# Patient Record
Sex: Female | Born: 1975 | Race: White | Hispanic: No | Marital: Married | Smoking: Never smoker
Health system: Southern US, Community
[De-identification: ages and names within clinical notes are randomized; demographics above are authoritative.]

## PROBLEM LIST (undated history)

## (undated) ENCOUNTER — Inpatient Hospital Stay (HOSPITAL_COMMUNITY): Payer: Self-pay

## (undated) ENCOUNTER — Ambulatory Visit: Payer: Commercial Managed Care - PPO

## (undated) DIAGNOSIS — K219 Gastro-esophageal reflux disease without esophagitis: Secondary | ICD-10-CM

## (undated) DIAGNOSIS — F329 Major depressive disorder, single episode, unspecified: Secondary | ICD-10-CM

## (undated) DIAGNOSIS — E7800 Pure hypercholesterolemia, unspecified: Secondary | ICD-10-CM

## (undated) DIAGNOSIS — F32A Depression, unspecified: Secondary | ICD-10-CM

## (undated) DIAGNOSIS — Z8701 Personal history of pneumonia (recurrent): Secondary | ICD-10-CM

## (undated) DIAGNOSIS — Z8659 Personal history of other mental and behavioral disorders: Secondary | ICD-10-CM

## (undated) DIAGNOSIS — M545 Low back pain, unspecified: Secondary | ICD-10-CM

## (undated) DIAGNOSIS — F419 Anxiety disorder, unspecified: Secondary | ICD-10-CM

## (undated) DIAGNOSIS — R7303 Prediabetes: Secondary | ICD-10-CM

## (undated) HISTORY — DX: Major depressive disorder, single episode, unspecified: F32.9

## (undated) HISTORY — DX: Depression, unspecified: F32.A

## (undated) HISTORY — DX: Anxiety disorder, unspecified: F41.9

## (undated) HISTORY — DX: Personal history of pneumonia (recurrent): Z87.01

## (undated) HISTORY — DX: Prediabetes: R73.03

## (undated) HISTORY — DX: Pure hypercholesterolemia, unspecified: E78.00

## (undated) HISTORY — DX: Personal history of other mental and behavioral disorders: Z86.59

## (undated) HISTORY — DX: Low back pain: M54.5

## (undated) HISTORY — PX: TONSILLECTOMY: SHX5217

## (undated) HISTORY — DX: Gastro-esophageal reflux disease without esophagitis: K21.9

## (undated) HISTORY — PX: APPENDECTOMY: SHX54

## (undated) HISTORY — DX: Low back pain, unspecified: M54.50

---

## 1993-11-06 HISTORY — PX: BREAST REDUCTION SURGERY: SHX8

## 2009-03-25 ENCOUNTER — Emergency Department (HOSPITAL_COMMUNITY): Admission: EM | Admit: 2009-03-25 | Discharge: 2009-03-25 | Payer: Self-pay | Admitting: Emergency Medicine

## 2009-05-26 ENCOUNTER — Ambulatory Visit: Payer: Self-pay | Admitting: Internal Medicine

## 2009-06-10 ENCOUNTER — Ambulatory Visit: Payer: Self-pay | Admitting: Internal Medicine

## 2009-07-08 ENCOUNTER — Ambulatory Visit: Payer: Self-pay | Admitting: Internal Medicine

## 2009-09-16 ENCOUNTER — Encounter: Payer: Self-pay | Admitting: Internal Medicine

## 2010-05-12 ENCOUNTER — Ambulatory Visit: Payer: Self-pay | Admitting: Pulmonary Disease

## 2010-05-12 DIAGNOSIS — R7309 Other abnormal glucose: Secondary | ICD-10-CM | POA: Insufficient documentation

## 2010-05-12 DIAGNOSIS — K219 Gastro-esophageal reflux disease without esophagitis: Secondary | ICD-10-CM

## 2010-05-12 DIAGNOSIS — E7800 Pure hypercholesterolemia, unspecified: Secondary | ICD-10-CM

## 2010-06-01 ENCOUNTER — Telehealth: Payer: Self-pay | Admitting: Internal Medicine

## 2010-06-21 ENCOUNTER — Ambulatory Visit: Payer: Self-pay | Admitting: Internal Medicine

## 2010-06-23 ENCOUNTER — Ambulatory Visit: Payer: Self-pay | Admitting: Pulmonary Disease

## 2010-06-27 ENCOUNTER — Ambulatory Visit: Payer: Self-pay | Admitting: Pulmonary Disease

## 2010-06-28 DIAGNOSIS — F329 Major depressive disorder, single episode, unspecified: Secondary | ICD-10-CM

## 2010-06-28 DIAGNOSIS — M545 Low back pain: Secondary | ICD-10-CM

## 2010-06-28 DIAGNOSIS — J189 Pneumonia, unspecified organism: Secondary | ICD-10-CM | POA: Insufficient documentation

## 2010-06-28 DIAGNOSIS — K5900 Constipation, unspecified: Secondary | ICD-10-CM | POA: Insufficient documentation

## 2010-07-03 LAB — CONVERTED CEMR LAB
BUN: 11 mg/dL (ref 6–23)
Basophils Absolute: 0 10*3/uL (ref 0.0–0.1)
Basophils Relative: 0.5 % (ref 0.0–3.0)
Bilirubin, Direct: 0.1 mg/dL (ref 0.0–0.3)
Creatinine, Ser: 0.7 mg/dL (ref 0.4–1.2)
Glucose, Bld: 83 mg/dL (ref 70–99)
Hemoglobin: 14 g/dL (ref 12.0–15.0)
LDL Cholesterol: 95 mg/dL (ref 0–99)
Lymphocytes Relative: 34.2 % (ref 12.0–46.0)
Lymphs Abs: 2.4 10*3/uL (ref 0.7–4.0)
MCHC: 34.5 g/dL (ref 30.0–36.0)
Monocytes Absolute: 0.4 10*3/uL (ref 0.1–1.0)
Monocytes Relative: 5.9 % (ref 3.0–12.0)
Neutrophils Relative %: 58.8 % (ref 43.0–77.0)
Platelets: 280 10*3/uL (ref 150.0–400.0)
RDW: 12.7 % (ref 11.5–14.6)
TSH: 1.97 microintl units/mL (ref 0.35–5.50)
Total Bilirubin: 0.9 mg/dL (ref 0.3–1.2)
Total CHOL/HDL Ratio: 4
Total Protein: 6.9 g/dL (ref 6.0–8.3)
Triglycerides: 113 mg/dL (ref 0.0–149.0)

## 2010-07-05 ENCOUNTER — Ambulatory Visit: Payer: Self-pay | Admitting: Internal Medicine

## 2010-07-19 ENCOUNTER — Ambulatory Visit: Payer: Self-pay | Admitting: Internal Medicine

## 2010-08-03 ENCOUNTER — Ambulatory Visit: Payer: Self-pay | Admitting: Gastroenterology

## 2010-08-22 ENCOUNTER — Telehealth (INDEPENDENT_AMBULATORY_CARE_PROVIDER_SITE_OTHER): Payer: Self-pay | Admitting: *Deleted

## 2010-09-27 ENCOUNTER — Ambulatory Visit: Payer: Self-pay | Admitting: Internal Medicine

## 2010-09-27 ENCOUNTER — Telehealth: Payer: Self-pay | Admitting: Internal Medicine

## 2010-10-05 DIAGNOSIS — J018 Other acute sinusitis: Secondary | ICD-10-CM | POA: Insufficient documentation

## 2010-11-10 ENCOUNTER — Telehealth: Payer: Self-pay | Admitting: Pulmonary Disease

## 2010-12-06 NOTE — Progress Notes (Signed)
Summary: rx requests---d/c temazepam--rx for xanax and nexium  Phone Note Call from Patient   Caller: Patient Call For: nadel Summary of Call: needs nexium- 90 days supply- also would like to change from restoril (generic) to xanex if sn thinks this would help with sleep. [facility] out pt pharm. x [phone] Initial call taken by: Tivis Ringer, CNA,  August 22, 2010 9:34 AM  Follow-up for Phone Call        ATC pt at her ext.  She has gone to a meeting for an hour.  Will await for her to get back to address this message. Aundra Millet Reynolds LPN  August 22, 2010 10:09 AM   spoke with pt.  pt states she is currently on Temazepam to help her sleep at night.  However, pt states she has a hard time "shutting her mind off at night" even while taking Temazepam  and wanted to know if she could try Xanax instead to help with this.  Pt states she has tried Ambien in the past and dislikes how "it makes her feel."  States it was "too strong."  Please advise.  Aundra Millet Reynolds LPN  August 22, 2010 11:48 AM   Additional Follow-up for Phone Call Additional follow up Details #1::        per SN---ok for pt to have the nexium 90 day supply  and d/c the restoril and change to alprazolam 0.5mg   #90  1 by mouth at bedtime as needed for sleep.  thanks Randell Loop CMA  August 22, 2010 12:26 PM     Additional Follow-up for Phone Call Additional follow up Details #2::    spoke with pt.  pt is aware of SN's recs and rx sent to pharmacy.  Aundra Millet Reynolds LPN  August 22, 2010 1:20 PM   New/Updated Medications: ALPRAZOLAM 0.5 MG TABS (ALPRAZOLAM) Take 1 tab by mouth at bedtime as needed for sleep Prescriptions: NEXIUM 40 MG CPDR (ESOMEPRAZOLE MAGNESIUM) Take 1 capsule by mouth once a day as needed  #90 x 3   Entered by:   Arman Filter LPN   Authorized by:   Michele Mcalpine MD   Signed by:   Arman Filter LPN on 04/54/0981   Method used:   Telephoned to ...       [facility]* (retail)       [address]       [address] Shipping/mailing       [place], [place]  [address]       Ph: [phone]       Fax: [phone]   RxID:   [id number] ALPRAZOLAM 0.5 MG TABS (ALPRAZOLAM) Take 1 tab by mouth at bedtime as needed for sleep  #90 x 0   Entered by:   Arman Filter LPN   Authorized by:   Michele Mcalpine MD   Signed by:   Arman Filter LPN on 36/64/4034   Method used:   Telephoned to ...       [facility]* (retail)       [address]       [address] Shipping/mailing       [place], [place]  [address]       Ph: [phone]       Fax: [phone]   RxID:   [id number]

## 2010-12-06 NOTE — Assessment & Plan Note (Signed)
Summary: cpx/la   CC:  New patient eval & CPX....  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   Izzabell enjoys excellent general medical health & is only taking some Nexium Prn... 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 no new complaints or concerns> see problem list below...   Current Problems:   PHYSICAL EXAMINATION (ICD-V70.0) - GYN= Dr[facility]vard w/ miscarriage in 2011...   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  ~  CXR 8/11 was clear, WNL, NAD...  HYPERCHOLESTEROLEMIA (ICD-272.0) - on diet alone... prev tried on Simcor but INTOL w/ flushing, itching, etc...  ~  FLP 8/11 showed TChol 154, TG 113, HDL 37, LDL 95... rec> diet + incr exerc, get wt down.  DIABETES MELLITUS, BORDERLINE (ICD-790.29) - she reports hx abn GTT in past- no meds, on diet alone & we discussed the benefits of weight reduction...  ~  labs 8/11 showed BS= 83, A1c= wasn't done.  G E R D (ICD-530.81) - on Nexium 40mg  Prn  CONSTIPATION (ICD-564.00) - she has recently entered a constip research study under the direction of DrPerry for GI.  Hx of BACK PAIN, LUMBAR (ICD-724.2) - she has been eval at [facility] & from Chiropractor, using OTC meds as needed.  Hx of DEPRESSION (ICD-311) - she was prev on Lexapro for awhile but has been off >71yr & doing satis... she does use TEMAZEPAM 15mg  Qhs Prn for insomnia...   Preventive Screening-Counseling & Management  Alcohol-Tobacco     Smoking Status: never  Allergies: 1)  ! Sulfa  Comments:  Nurse/Medical Assistant: The patient's  medications and allergies were reviewed with the patient and were updated in the Medication and Allergy Lists.  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 emphysema/COPD - MGF, PGM allergies - sister asthma - sister heart disease - father, MGM  clotting disorders - MGM rheumatism - MGM cancer - mat aunt (breast) DM - father  Social History: Reviewed history from 05/12/2010 and no changes required. never smoked occ alcohol separated, but in a relationship no children works for [employer]- Smoking Status:  never  Review of Systems  The patient denies fever, chills, sweats, anorexia, fatigue, weakness, malaise, weight loss, sleep disorder, blurring, diplopia, eye irritation, eye discharge, vision loss, eye pain, photophobia, earache, ear discharge, tinnitus, decreased hearing, nasal congestion, nosebleeds, sore throat, hoarseness, chest pain, palpitations, syncope, dyspnea on exertion, orthopnea, PND, peripheral edema, cough, dyspnea at rest, excessive sputum, hemoptysis, wheezing, pleurisy, nausea, vomiting, diarrhea, constipation, change in bowel habits, abdominal pain, melena, hematochezia, jaundice, gas/bloating, indigestion/heartburn, dysphagia, odynophagia, dysuria, hematuria, urinary frequency, urinary hesitancy, nocturia, incontinence, back pain, joint pain, joint swelling, muscle cramps, muscle weakness, stiffness, arthritis, sciatica, restless legs, leg pain at night, leg pain with exertion, rash, itching, dryness, suspicious lesions, paralysis, paresthesias, seizures, tremors,  vertigo, transient blindness, frequent falls, frequent  headaches, difficulty walking, depression, anxiety, memory loss, confusion, cold intolerance, heat intolerance, polydipsia, polyphagia, polyuria, unusual weight change, abnormal bruising, bleeding, enlarged lymph nodes, urticaria, allergic rash, hay fever, and recurrent infections.    Vital Signs:  Patient profile:   35 year old female Height:      64 inches Weight:      185.38 pounds BMI:     31.94 O2 Sat:      99 % on Room air Temp:     98.8 degrees F oral Pulse rate:   75 / minute BP sitting:   130 / 72  (left arm) Cuff size:   regular  Vitals Entered By: Randell Loop CMA (June 27, 2010 1:58 PM)  O2 Sat at Rest %:  99 O2 Flow:  Room air CC: New patient eval & CPX... Is Patient Diabetic? No Pain Assessment Patient in pain? no      Comments meds updated today---pt brought all meds today   Physical Exam  Additional Exam:  WD, WN, 35 y/o WF in NAD... GENERAL:  Alert & oriented; pleasant & cooperative... HEENT:  [place]/AT, EOM-wnl, PERRLA, Fundi-benign, EACs-clear, TMs-wnl, NOSE-clear, THROAT-clear & wnl. NECK:  Supple w/ full ROM; no JVD; normal carotid impulses w/o bruits; no thyromegaly or nodules palpated; no lymphadenopathy. CHEST:  Clear to P & A; without wheezes/ rales/ or rhonchi. HEART:  Regular Rhythm; without murmurs/ rubs/ or gallops. ABDOMEN:  Soft & nontender; normal bowel sounds; no organomegaly or masses detected. EXT: without deformities or arthritic changes; no varicose veins/ venous insuffic/ or edema. NEURO:  CN's intact; motor testing normal; sensory testing normal; gait normal & balance OK. DERM:  No lesions noted; no rash etc...    CXR  Procedure date:  06/27/2010  Findings:      CHEST - 2 VIEW Comparison: None   Findings: Heart size is normal and the vascularity is normal.  The lungs are clear without infiltrate or effusion.   Negative for active TB.   IMPRESSION: Negative   Read By:  Camelia Phenes,  M.D.   EKG  Procedure date:   06/27/2010  Findings:      Normal sinus rhythm with rate of:  80/ min... Trcing is WNL, NAD...  SN   MISC. Report  Procedure date:  06/23/2010  Findings:      Lipid Panel (LIPID)   Cholesterol               154 mg/dL                   5-621   Triglycerides             113.0 mg/dL                 3.0-865.7   HDL                  [L]  84.69 mg/dL                 >62.95   LDL Cholesterol           95 mg/dL                    2-84  BMP (METABOL)   Sodium                    140 mEq/L  135-145   Potassium                 4.6 mEq/L                   3.5-5.1   Chloride                  104 mEq/L                   96-112   Carbon Dioxide            27 mEq/L                    19-32   Glucose                   83 mg/dL                    36-64   BUN                       11 mg/dL                    4-03   Creatinine                0.7 mg/dL                   4.7-4.2   Calcium                   9.4 mg/dL                   5.9-56.3   GFR                       109.79 mL/min               >60  Hepatic/Liver Function Panel (HEPATIC)   Total Bilirubin           0.9 mg/dL                   8.7-5.6   Direct Bilirubin          0.1 mg/dL                   4.3-3.2   Alkaline Phosphatase      52 U/L                      39-117   AST                       28 U/L                      0-37   ALT                       28 U/L                      0-35   Total Protein             6.9 g/dL                    9.5-1.8   Albumin                   4.6 g/dL  3.5-5.2  Comments:      CBC Platelet w/Diff (CBCD)   White Cell Count          7.0 K/uL                    4.5-10.5   Red Cell Count            4.50 Mil/uL                 3.87-5.11   Hemoglobin                14.0 g/dL                   16.1-09.6   Hematocrit                40.6 %                      36.0-46.0   MCV                       90.1 fl                     78.0-100.0   Platelet Count            280.0 K/uL                   150.0-400.0   Neutrophil %              58.8 %                      43.0-77.0   Lymphocyte %              34.2 %                      12.0-46.0   Monocyte %                5.9 %                       3.0-12.0   Eosinophils%              0.6 %                       0.0-5.0   Basophils %               0.5 %                       0.0-3.0  TSH (TSH)   FastTSH                   1.97 uIU/mL                 0.35-5.50  Vitamin D (25-Hydroxy) (04540)  Vitamin D (25-Hydroxy)                             53 ng/mL                    30-89   Impression & Recommendations:  Problem # 1:  PHYSICAL EXAMINATION (ICD-V70.0) We reviewed her CXR, EKG, Labs... 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  Complete Medication List: 1)  Nexium 40 Mg Cpdr (Esomeprazole magnesium) .... Take 1 capsule by mouth once a day as needed 2)  Temazepam 15 Mg Caps (Temazepam) .... Take 1 capsule by mouth at bedtime as needed 3)  Prenatal Plus/iron 27-1 Mg Tabs (Prenatal vit-fe fumarate-fa) .... 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 Trcing is WNL, NAD...  SN

## 2010-12-06 NOTE — Progress Notes (Signed)
Summary: RX needed  Phone Note Call from Patient   Caller: Patient Call For: Sharnese Heath Reason for Call: Acute Illness Summary of Call: Pt states she is having cough at night-keeping her up; ear pain, ?viral infection again. Wants Promethazine cough syrup and zpak. Initial call taken by: Reynaldo Minium CMA,  June 01, 2010 12:58 PM  Follow-up for Phone Call        Per CDY ok to give Zpak #1 take as directed no refills and Promethazine codiene cough syrup #269ml take 1 teaspoon every 6 hours as needed no refills.Reynaldo Minium CMA  June 01, 2010 12:59 PM   Pharmacy: [facility] Out pt.  Pt aware RX's called in .Reynaldo Minium CMA  June 01, 2010 12:59 PM     New/Updated Medications: PROMETHAZINE-CODEINE 6.25-10 MG/5ML SYRP (PROMETHAZINE-CODEINE) 1 teaspoon every 6 hours as needed ZITHROMAX Z-PAK 250 MG TABS (AZITHROMYCIN) take as directed Prescriptions: ZITHROMAX Z-PAK 250 MG TABS (AZITHROMYCIN) take as directed  #1 x 0   Entered by:   Reynaldo Minium CMA   Authorized by:   Waymon Budge MD   Signed by:   Reynaldo Minium CMA on 06/01/2010   Method used:   Telephoned to ...       [facility]* (retail)       [address]       [address] Shipping/mailing       [place], [place]  [address]       Ph: [phone]       Fax: [phone]   RxID:   [id number] PROMETHAZINE-CODEINE 6.25-10 MG/5ML SYRP (PROMETHAZINE-CODEINE) 1 teaspoon every 6 hours as needed  #276ml x 0   Entered by:   Reynaldo Minium CMA   Authorized by:   Waymon Budge MD   Signed by:   Reynaldo Minium CMA on 06/01/2010   Method used:   Telephoned to ...       [facility]* (retail)       [address]       [address] Shipping/mailing       [place], [place]  [address]       Ph: [phone]       Fax: [phone]   RxID:   [id number]

## 2010-12-06 NOTE — Progress Notes (Signed)
Summary: depo shot  Phone Note Call from Patient Call back at Home Phone [phone]   Caller: Patient Call For: young Reason for Call: Talk to Nurse Summary of Call: Patient finished abx yesterday, pt still sick.  She thinks she needs a depo shot. Initial call taken by: Lehman Prom,  September 27, 2010 2:08 PM  Follow-up for Phone Call        Per CDY-okay to put on for visit and give Depo and nasal tx.Reynaldo Minium CMA  September 27, 2010 3:40 PM

## 2010-12-06 NOTE — Assessment & Plan Note (Signed)
Summary: Acute NP office visit - back pain   CC:  hx sciatica.  right lower back pain radiating down in the right leg x3days and worse yesterday.  was given amirix yesterday around 10am and another at 9:30pm..  History of Present Illness: 35 yo WF w/  known hx of Hyperlipidemia, GERD, borderline DM.   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 4 days PTA woke with severe sore throat. Lab swab negative for strep. Right ear ache. Now less sore throat, doubts fever. productive cough white. She miscarried 2-3 weeks ago. No sick exposure.  May 12, 2010--Presents for an acute office visit. Complains of  right lower back pain radiating down in the right leg x3days, worse yesterday.  was given amirix yesterday around 10am and another at 9:30pm. Previous episodes of sciatica. Seen by urgent care and chriopractor in past. Taking aleve and ibuprofen.  Was given amrix- but made her very sleepy. Has been lifting dog (30lbs) up alot, she is sick w/ arthirits.  No extremity weakness, urinary symptoms, abd pain. LMP nml. Denies chest pain, orthopnea, hemoptysis, fever, n/v/d, edema, headache.  [facility] PCP previously, currently to establish w/ PCP later this year.   Medications Prior to Update: 1)  None  Current Medications (verified): 1)  Nexium 40 Mg Cpdr (Esomeprazole Magnesium) .... Take 1 Capsule By Mouth Once A Day As Needed 2)  Temazepam 15 Mg Caps (Temazepam) .... Take 1 Capsule By Mouth At Bedtime As Needed  Allergies (verified): 1)  ! 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.5) PNEUMONIA (ICD-486) HYPERLIPIDEMIA (ICD-272.4) G E R D (ICD-530.81) DIABETES MELLITUS, BORDERLINE (ICD-790.29) DEPRESSION (ICD-311) Hx of BRONCHITIS (ICD-491.20) URI (ICD-465.9)    Past Surgical History: breast reduction 1995 Appendectomy at age 34 Tonsillectomy at age 37  Family History: emphysema/COPD - MGF, PGM allergies - sister asthma - sister heart disease - father, MGM  clotting disorders - MGM rheumatism - MGM cancer - mat aunt (breast) DM - father  Social History: never smoked occ alcohol separated, but in a relationship no children works for [employer]-  Review of Systems      See HPI  Vital Signs:  Patient profile:   35 year old female Height:      64 inches Weight:      188 pounds BMI:     32.39 O2 Sat:      97 % on Room air Temp:     98.0 degrees F oral Pulse rate:   76 / minute BP sitting:   124 / 84  (left arm) Cuff size:   regular  Vitals Entered By: Boone Master CNA/MA (May 12, 2010 3:46 PM)  O2 Flow:  Room air CC: hx sciatica.  right lower back pain radiating down in the right leg x3days, worse yesterday.  was given amirix yesterday around 10am and another at 9:30pm. Is Patient Diabetic? No Comments Medications reviewed with patient Daytime contact number verified with patient. Boone Master CNA/MA  May 12, 2010 3:37 PM    Physical Exam  Additional Exam:  General: A/Ox3; pleasant and cooperative, NAD, SKIN: no rash, lesions NODES: no lymphadenopathy HEENT: [place]/AT, EOM- WNL, Conjuctivae- clear, PERRLA, TM-WNL, Nose- clear, Throat- red without drainage or exudate. NECK: Supple w/ fair ROM, JVD- none,  CHEST: Clear to P&A, occasional cough, nonproductive HEART: RRR, no m/g/r heard ABDOMEN: soft nt, bs nml , no guarding.  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  Alternate ice and heat three times a day for as needed  Please contact office for sooner follow up if symptoms do not improve or worsen  follow up Dr. Kriste Basque as scheduled for physical.   Orders: Est. Patient Level III (25366)  Medications Added to Medication List This Visit: 1)  Nexium 40 Mg Cpdr (Esomeprazole magnesium) .... Take 1 capsule by mouth once a day as needed 2)  Temazepam 15 Mg Caps (Temazepam) .... Take 1 capsule by mouth at bedtime as needed 3)  Skelaxin 800 Mg Tabs (Metaxalone) .Marland Kitchen.. 1 by mouth three times a day as needed muscle spasm 4)  Tramadol Hcl 50 Mg Tabs (Tramadol hcl) .Marland Kitchen.. 1 by mouth every 4-6 hrs as needed pain  Complete Medication List: 1)  Nexium 40 Mg Cpdr (Esomeprazole magnesium) .... Take 1 capsule by mouth once a day as needed 2)  Temazepam 15 Mg Caps (Temazepam) .... Take 1 capsule by mouth at bedtime as needed 3)  Skelaxin 800 Mg Tabs (Metaxalone) .Marland Kitchen.. 1 by mouth three times a day as needed muscle spasm 4)  Tramadol Hcl 50 Mg Tabs (Tramadol hcl) .Marland Kitchen.. 1 by mouth every 4-6 hrs as needed pain   Patient Instructions: 1)  Ibuprofen 200mg  4 tabs three times a day w/ food for 7 days 2)  Skelaxin 800mg  1 by mouth three times a day as needed muscle spasm  3)  Tramadol 50mg  every 4 hr as needed pain, may make you sleepy.  4)  Alternate ice and heat three times a day for as needed  5)  Please contact office for sooner follow up if symptoms do not improve or worsen  6)  follow up Dr. Kriste Basque as scheduled for physical.  Prescriptions: TRAMADOL HCL 50 MG TABS (TRAMADOL  HCL) 1 by mouth every 4-6 hrs as needed pain  #20 x 0   Entered and Authorized by:   Rubye Oaks NP   Signed by:   Rubye Oaks NP on 05/12/2010   Method used:   Electronically to        [facility]* (retail)       [address]       [address] [place] [address]/mailing       [place], [place]  [address]       Ph: [phone]       Fax: [phone]   RxID:   [id number] SKELAXIN 800 MG TABS (METAXALONE) 1 by mouth three times a day as needed muscle spasm  #30 x 0   Entered and Authorized by:   Lively Haberman  Rickey Farrier NP   Signed by:   Rubye Oaks NP on 05/12/2010   Method used:   Electronically to        [facility]* (retail)       [address]       [address] [place] [address]/mailing       [place], [place]  [address]       Ph: [phone]       Fax: [phone]   RxID:   [id number]    Immunization History:  Influenza Immunization History:    Influenza:  historical (08/06/2009)

## 2010-12-08 NOTE — Progress Notes (Signed)
Summary: sinus / cough  Phone Note Call from Patient   Caller: Patient Call For: Tanisia Yokley Summary of Call: pt c/o sinus/ head congestion x 1 wk. cough w/ green mucus (was yellow until 2 days ago now green. has taken musinex OTC but is out of this now. x 349. [facility] out pt pharmacy Initial call taken by: Tivis Ringer, CNA,  November 10, 2010 9:15 AM  Follow-up for Phone Call        pt c/o chest and sinus congestion, productive cough with green phlegm x 1 week. Pt has tried mucinex without relief. Please advsie. Carron Curie CMA  November 10, 2010 10:04 AM   Additional Follow-up for Phone Call Additional follow up Details #1::        per sn call in augmentin 875 #14 1 by mouth two times a day if no pcn allergies. Continue mucinex 600 mg 2 by mouth two times a day with lots of fluids and tylenol four times a day  Mindy Silva  November 10, 2010 12:17 PM   rx sent.pt aware.Carron Curie CMA  November 10, 2010 12:20 PM     New/Updated Medications: AUGMENTIN 875-125 MG TABS (AMOXICILLIN-POT CLAVULANATE) Take 1 tablet by mouth two times a day Prescriptions: AUGMENTIN 875-125 MG TABS (AMOXICILLIN-POT CLAVULANATE) Take 1 tablet by mouth two times a day  #14 x 0   Entered by:   Carron Curie CMA   Authorized by:   Michele Mcalpine MD   Signed by:   Carron Curie CMA on 11/10/2010   Method used:   Electronically to        [facility]* (retail)       [address]       [address] Shipping/mailing       [place], [place]  [address]       Ph: [phone]       Fax: [phone]   RxID:   [id number]

## 2010-12-08 NOTE — Assessment & Plan Note (Signed)
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 4 days PTA woke with severe sore throat. Lab swab negative for strep. Right ear ache. Now less sore throat, doubts fever. productive cough white. She miscarried 2-3 weeks ago. No sick exposure.  October 16, 2010- Rhinosinusitis Acute visit-complains of nasal congestion and stopped up nose; has finished a round of Avelox..   Preventive Screening-Counseling & Management  Alcohol-Tobacco     Smoking Status: never  Current Medications (verified): 1)  Nexium 40 Mg Cpdr (Esomeprazole Magnesium) .... Take 1 Capsule By Mouth Once A Day As Needed 2)  Alprazolam 0.5 Mg Tabs (Alprazolam) .... Take 1 Tab By Mouth At Bedtime As Needed For Sleep 3)  Prenatal Plus/iron 27-1 Mg Tabs (Prenatal Vit-Fe Fumarate-Fa) .... Take 1 Tablet By Mouth Once A Day  Allergies (verified): 1)  ! Sulfa  Vital Signs:  Patient profile:   35 year old female Height:      64 inches Weight:      184 pounds BMI:     31.70 O2 Sat:      96 % on Room air Pulse rate:   76 / minute BP sitting:   118 / 80  (left arm) Cuff size:   regular  Vitals Entered By: Reynaldo Minium CMA (September 27, 2010 3:40 PM)  O2 Flow:  Room air CC: Acute visit-complains of nasal congestion and stopped up nose; has finished a round of Avelox.   Physical Exam  Additional Exam:  General: A/Ox3; pleasant and cooperative, NAD, SKIN: no rash, lesions NODES: no lymphadenopathy HEENT: [place]/AT, EOM- WNL, Conjuctivae- clear, PERRLA, TM-WNL, Nose- congested, turbinate edema, Throat- without drainage or exudate. NECK: Supple w/ fair ROM, JVD- none,  CHEST: Clear to P&A, occasional cough, nonproductive HEART: RRR, no m/g/r heard ABDOMEN: soft nt, bs nml , no guarding.    MVH:QION, nl pulses, no edema  NEURO: Grossly intact to observation Muscu:       Impression & Recommendations:  Problem # 1:  RHINOSINUSITIS, ACUTE (ICD-461.8) Giving neb neo, depo  Other Orders: No Charge Patient Arrived (NCPA0) (NCPA0)  Patient Instructions: 1)  Please schedule a follow-up appointment as needed.     Medication Administration  Injection # 1:    Medication: Depo- Medrol 80mg     Route: SQ    Site: RUOQ gluteus    Exp Date: 03/2013    Lot #: [id number]    Mfr: Pharmacia    Patient tolerated injection without complications    Given by: Vivianne Spence  Medication # 1:    Medication: EMR miscellaneous medications    Dose: 3 drops    Route: intranasal    Exp Date: 02/2012    Lot #: [id number]    Mfr: Bayer    Comments: Neo-synephrine    Patient tolerated medication without complications    Given by: Vivianne Spence  Orders Added: 1)  No Charge Patient Arrived (NCPA0) [NCPA0]

## 2010-12-12 ENCOUNTER — Encounter: Payer: Self-pay | Admitting: Pulmonary Disease

## 2010-12-12 ENCOUNTER — Ambulatory Visit (INDEPENDENT_AMBULATORY_CARE_PROVIDER_SITE_OTHER)
Admission: RE | Admit: 2010-12-12 | Discharge: 2010-12-12 | Disposition: A | Discharge status: Home / Self Care | Payer: Commercial Managed Care - PPO | Source: Ambulatory Visit | Attending: Pulmonary Disease | Admitting: Pulmonary Disease

## 2010-12-12 ENCOUNTER — Other Ambulatory Visit: Payer: Self-pay | Admitting: Pulmonary Disease

## 2010-12-12 ENCOUNTER — Ambulatory Visit (INDEPENDENT_AMBULATORY_CARE_PROVIDER_SITE_OTHER): Payer: Commercial Managed Care - PPO | Admitting: Pulmonary Disease

## 2010-12-12 DIAGNOSIS — E7800 Pure hypercholesterolemia, unspecified: Secondary | ICD-10-CM

## 2010-12-12 DIAGNOSIS — R7611 Nonspecific reaction to tuberculin skin test without active tuberculosis: Secondary | ICD-10-CM

## 2010-12-12 DIAGNOSIS — R0602 Shortness of breath: Secondary | ICD-10-CM

## 2010-12-12 DIAGNOSIS — J209 Acute bronchitis, unspecified: Secondary | ICD-10-CM

## 2010-12-12 DIAGNOSIS — R05 Cough: Secondary | ICD-10-CM

## 2010-12-12 DIAGNOSIS — J189 Pneumonia, unspecified organism: Secondary | ICD-10-CM

## 2010-12-12 DIAGNOSIS — K219 Gastro-esophageal reflux disease without esophagitis: Secondary | ICD-10-CM

## 2010-12-22 NOTE — Assessment & Plan Note (Signed)
Summary: sick/ms.   CC:  6 month ROV & add-on for URI, cough, and bronchitis....  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    ~  Aug11:  Charlsey enjoys excellent general medical health & is only taking some Nexium Prn... 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 no new complaints or concerns> see problem list below...   ~  December 12, 2010:  Add-on appt for URI w/ head congestion, green drainage, cough, hoarseness, temp to 101 w/ aching, sore, tired, etc... she had some left over Augmentin & atarted this Bid x 2d now, plus OTC Mucinex, Fluids, Aleve, Tylenol... CXR is clear & we discussed Rx w/ 7d Augmentin & Depo/ Pred taper...   Current Problems:   PHYSICAL EXAMINATION (ICD-V70.0) - GYN= [facility] w/ miscarriage in 2011...   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  ~  CXR 8/11 was clear, WNL, NAD... f/u film 2/11 w. cough was similarly clear, NAD.  HYPERCHOLESTEROLEMIA (ICD-272.0) - on diet alone... prev tried on Simcor but INTOL w/ flushing, itching, etc...  ~  FLP 8/11 showed TChol 154, TG 113, HDL 37, LDL 95... rec> diet + incr exerc, get wt down.  DIABETES MELLITUS, BORDERLINE (ICD-790.29) - she reports hx abn GTT in past- no meds, on diet alone & we discussed the benefits of weight reduction...  ~  labs 8/11 showed BS= 83, A1c= wasn't done.  G E R D (ICD-530.81) - on Nexium 40mg  Prn  CONSTIPATION (ICD-564.00) - she has recently entered a constip research study under the direction of DrPerry for GI.  Hx of BACK PAIN,  LUMBAR (ICD-724.2) - she has been eval at [facility] & from Chiropractor, using OTC meds as needed.  Hx of DEPRESSION (ICD-311) - she was prev on Lexapro for awhile but has been off >46yr & doing satis... she does use TEMAZEPAM 15mg  Qhs Prn for insomnia...   Current Medications (verified): 1)  Nexium 40 Mg Cpdr (Esomeprazole Magnesium) .... Take 1 Capsule By Mouth Once A Day As Needed 2)  Alprazolam 0.5 Mg Tabs (Alprazolam) .... Take 1 Tab By Mouth At Bedtime As Needed For Sleep 3)  Augmentin 875-125 Mg Tabs (Amoxicillin-Pot Clavulanate) .... Take 1 Tablet By Mouth Two Times A Day 4)  Mucinex 600 Mg Xr12h-Tab (Guaifenesin) .Marland Kitchen.. 1 Every 4 Hrs 5)  Aleve 220 Mg Tabs (Naproxen Sodium) .... 2 Every 6 Hrs 6)  Delsym 30 Mg/52ml Lqcr (Dextromethorphan Polistirex) .... As Needed  Allergies (verified): 1)  ! Sulfa  Past History:  Past Medical History: Hx of PNEUMONIA (ICD-486) POSITIVE TB SKIN TEST, WITHOUT TUBERCULOSIS (ICD-795.5) HYPERCHOLESTEROLEMIA (ICD-272.0) DIABETES MELLITUS, BORDERLINE (ICD-790.29) G E R D (ICD-530.81) CONSTIPATION (ICD-564.00) Hx of BACK PAIN, LUMBAR (ICD-724.2) Hx of DEPRESSION (ICD-311)  Past Surgical History: breast reduction 1995 Appendectomy at age 31 Tonsillectomy at age 89  Family History: Reviewed history from 05/12/2010 and no changes required. emphysema/COPD - MGF, PGM allergies - sister asthma - sister heart disease - father, MGM  clotting disorders - MGM rheumatism -  MGM cancer - mat aunt (breast) DM - father  Social History: Reviewed history from 05/12/2010 and no changes required. never smoked occ alcohol separated, but in a relationship no children works for [employer]-  Review of Systems      See HPI       The patient complains of hoarseness and dyspnea on exertion.  The patient denies anorexia, fever, weight loss, weight gain, vision loss, decreased hearing, chest pain, syncope, peripheral edema, prolonged cough, headaches,  hemoptysis, abdominal pain, melena, hematochezia, severe indigestion/heartburn, hematuria, incontinence, muscle weakness, suspicious skin lesions, transient blindness, difficulty walking, depression, unusual weight change, abnormal bleeding, enlarged lymph nodes, and angioedema.    Vital Signs:  Patient profile:   35 year old female Height:      64 inches Weight:      188.38 pounds O2 Sat:      98 % on Room air Temp:     97.9 degrees F oral Pulse rate:   98 / minute BP sitting:   120 / 78  (left arm) Cuff size:   regular  Vitals Entered By: Carver Fila (December 12, 2010 1:56 PM)  O2 Flow:  Room air CC: 6 month ROV & add-on for URI, cough, bronchitis... Comments meds and allergies updated Phone number updated Carver Fila  December 12, 2010 1:57 PM    Physical Exam  Additional Exam:  WD, WN, 35 y/o WF in NAD... GENERAL:  Alert & oriented; pleasant & cooperative... HEENT:  [place]/AT, EOM-wnl, PERRLA, Fundi-benign, EACs-clear, TMs-wnl, NOSE-clear, THROAT-clear & wnl. NECK:  Supple w/ full ROM; no JVD; normal carotid impulses w/o bruits; no thyromegaly or nodules palpated; no lymphadenopathy. CHEST:  Clear to P & A; without wheezes/ rales/ or rhonchi. HEART:  Regular Rhythm; without murmurs/ rubs/ or gallops. ABDOMEN:  Soft & nontender; normal bowel sounds; no organomegaly or masses detected. EXT: without deformities or arthritic changes; no varicose veins/ venous insuffic/ or edema. NEURO:  CN's intact; motor testing normal; sensory testing normal; gait normal & balance OK. DERM:  No lesions noted; no rash etc...    Impression & Recommendations:  Problem # 1:  ACUTE BRONCHITIS (ICD-466.0) CXR is clear> no pneumonia & we discussed Rx w/ Augmentin, Depo80/ Pred 3dtaper... OK to continue Mucinex, Fluids, Aleve, Tylenol, etc... Her updated medication list for this problem includes:    Mucinex 600 Mg Xr12h-tab (Guaifenesin) .Marland Kitchen... 1 every 4 hrs    Delsym 30 Mg/17ml Lqcr (Dextromethorphan  polistirex) .Marland Kitchen... As needed    Augmentin 875-125 Mg Tabs (Amoxicillin-pot clavulanate) .Marland Kitchen... Take 1 tablet by mouth two times a day  Orders: Depo- Medrol 80mg  (J1040) Admin of Therapeutic Inj  intramuscular or subcutaneous ([id number])  Problem # 2:  HYPERCHOLESTEROLEMIA (ICD-272.0) On diet alone & reminded of low chol/ low fat recommendations...  Problem # 3:  DIABETES MELLITUS, BORDERLINE (ICD-790.29) Weight is up 4# & we reviewed diet recs and low carb restriction in order to lose weight...  Problem # 4:  GI >>> Stable, continue current Rx...  Complete Medication List: 1)  Nexium 40 Mg Cpdr (Esomeprazole magnesium) .... Take 1 capsule by mouth once a day as needed 2)  Alprazolam 0.5 Mg Tabs (Alprazolam) .... Take 1 tab by mouth at bedtime as needed for sleep 3)  Mucinex 600 Mg Xr12h-tab (Guaifenesin) .Marland Kitchen.. 1 every 4 hrs 4)  Delsym 30 Mg/57ml Lqcr (Dextromethorphan polistirex) .... As needed 5)  Augmentin 875-125 Mg Tabs (Amoxicillin-pot clavulanate) .... Take 1 tablet by mouth two times a day  6)  Prednisone 20 Mg Tabs (Prednisone) .... Take 1 tab by mouth two times a day x3d, then 1 tab daily x3d, then 1/2 tab daily til gone...  Patient Instructions: 1)  Today we updated your med list- see below.... 2)  We refilled your AUGMENTIN to take twice daily x7d course & we decided to give you a Depo shot & Prednisone tapering schedule for the airway inflammation (follow directions on the bottle).Marland KitchenMarland Kitchen 3)  Rest, plenty of fluids, continue the MUCINEX, and use the Aleve, tylenol, etc as needed... Prescriptions: PREDNISONE 20 MG TABS (PREDNISONE) take 1 tab by mouth two times a day x3d, then 1 tab daily x3d, then 1/2 tab daily til gone...  #12 x 0   Entered and Authorized by:   Michele Mcalpine MD   Signed by:   Michele Mcalpine MD on 12/12/2010   Method used:   Print then Give to Patient   RxID:   [id number] AUGMENTIN 875-125 MG TABS (AMOXICILLIN-POT CLAVULANATE) Take 1 tablet by mouth two times a  day  #14 x 0   Entered and Authorized by:   Michele Mcalpine MD   Signed by:   Michele Mcalpine MD on 12/12/2010   Method used:   Print then Give to Patient   RxID:   [id number]    Immunization History:  Influenza Immunization History:    Influenza:  historical (08/06/2010)    Medication Administration  Injection # 1:    Medication: Depo- Medrol 80mg     Diagnosis: ACUTE BRONCHITIS (ICD-466.0)    Route: SQ    Site: LUOQ gluteus    Exp Date: 05/2013    Lot #: [id number]    Mfr: Pharmacia    Patient tolerated injection without complications    Given by: Reynaldo Minium CMA (December 12, 2010 2:29 PM)  Orders Added: 1)  Est. Patient Level III [84696] 2)  Depo- Medrol 80mg  [J1040] 3)  Admin of Therapeutic Inj  intramuscular or subcutaneous [29528]

## 2010-12-22 NOTE — Miscellaneous (Signed)
Summary: Orders Update--cxr  Clinical Lists Changes  Problems: Added new problem of COUGH (ICD-786.2) Added new problem of SHORTNESS OF BREATH (ICD-786.05) Orders: Added new Test order of T-2 View CXR (71020TC) - Signed

## 2011-02-14 LAB — URINALYSIS, ROUTINE W REFLEX MICROSCOPIC
Glucose, UA: NEGATIVE mg/dL
Nitrite: NEGATIVE
Urobilinogen, UA: 1 mg/dL (ref 0.0–1.0)

## 2011-02-14 LAB — URINE MICROSCOPIC-ADD ON

## 2011-03-01 ENCOUNTER — Ambulatory Visit: Payer: Commercial Managed Care - PPO | Admitting: Women's Health

## 2011-03-14 ENCOUNTER — Other Ambulatory Visit: Payer: Commercial Managed Care - PPO

## 2011-03-14 ENCOUNTER — Other Ambulatory Visit: Payer: Self-pay | Admitting: Pulmonary Disease

## 2011-03-14 DIAGNOSIS — R110 Nausea: Secondary | ICD-10-CM

## 2011-03-14 LAB — HCG, SERUM, QUALITATIVE: Preg, Serum: NEGATIVE

## 2011-04-12 ENCOUNTER — Telehealth: Payer: Self-pay | Admitting: *Deleted

## 2011-04-12 MED ORDER — CIPROFLOXACIN HCL 250 MG PO TABS: 250.0000 mg | ORAL_TABLET | Freq: Two times a day (BID) | ORAL | Status: AC

## 2011-04-12 NOTE — Telephone Encounter (Signed)
PT C/O back pain, nausea, dark, strong urine .  Per SN  Ok to send in cipro 250mg    #14   1 po bid until gone.  Pt is aware of meds sent to the pharmacy

## 2011-05-02 ENCOUNTER — Ambulatory Visit: Payer: Commercial Managed Care - PPO | Admitting: Gynecology

## 2011-05-19 ENCOUNTER — Telehealth: Payer: Self-pay | Admitting: Adult Health

## 2011-05-19 MED ORDER — ALPRAZOLAM 0.5 MG PO TABS: 0.5000 mg | ORAL_TABLET | Freq: Every evening | ORAL | Status: DC | PRN

## 2011-05-19 NOTE — Telephone Encounter (Signed)
RX called to [facility] for #90 w/ no refills. Pt aware.

## 2011-05-19 NOTE — Telephone Encounter (Signed)
CORRECTION: SINCE [facility] IS NOW OPEN PT REQUESTS RX'S GO THERE. Jody [name]

## 2011-06-21 ENCOUNTER — Telehealth: Payer: Self-pay | Admitting: Pulmonary Disease

## 2011-06-21 MED ORDER — METHOCARBAMOL 500 MG PO TABS: 500.0000 mg | ORAL_TABLET | Freq: Four times a day (QID) | ORAL | Status: AC

## 2011-06-21 MED ORDER — MELOXICAM 7.5 MG PO TABS: 7.5000 mg | ORAL_TABLET | Freq: Every day | ORAL | Status: DC

## 2011-06-21 NOTE — Telephone Encounter (Signed)
Called and lmom to make pt aware that meds have been sent to the pharmacy per request.  Per SN  She will need to rest, use heat, robaxin 500mg    #50   1 po tid prn muscle spasm and mobic 7,5mg    #30  1 daily.  Pt is to call back for any concerns.

## 2011-06-21 NOTE — Telephone Encounter (Signed)
Called and spoke with pt and she stated that she twisted her back and can hardly walk.  Cleaning out her moms cabinet but did not lift anything but twisted wrong when she got up.  C/o pain in her lower back radiates into the right leg.  Pt requesting anti-inflammatory for her back.  SN please advise. thanks

## 2011-06-26 ENCOUNTER — Ambulatory Visit (INDEPENDENT_AMBULATORY_CARE_PROVIDER_SITE_OTHER): Payer: 59 | Admitting: Adult Health

## 2011-06-26 ENCOUNTER — Encounter: Payer: Self-pay | Admitting: *Deleted

## 2011-06-26 DIAGNOSIS — M545 Low back pain: Secondary | ICD-10-CM

## 2011-06-26 NOTE — Assessment & Plan Note (Signed)
Acute back strain  Plan:  Take Mobic daily for back pain As needed   Take 1/2 -1 Robaxin Three times a day  As needed  Muscle spasm Alternate ice and heat to  Back  Please contact office for sooner follow up if symptoms do not improve or worsen or seek emergency care  If not improving we can refer to Ortho

## 2011-06-26 NOTE — Patient Instructions (Signed)
Take Mobic daily for back pain As needed   Take 1/2 -1 Robaxin Three times a day  As needed  Muscle spasm Alternate ice and heat to  Back  Please contact office for sooner follow up if symptoms do not improve or worsen or seek emergency care  If not improving we can refer to Ortho

## 2011-06-26 NOTE — Progress Notes (Signed)
  Subjective:    Patient ID: Jody [name], female    DOB: 20-Feb-1976, 35 y.o.   MRN: [id number]  HPI 35 yo healthy female with known hx of GERD  06/26/2011 Acute OV  Pt presents for an acute office visit. Complains of right low back pain x5days with hands and feet tingling - reports happened after twisting while cleaning under bathroom cabinet. Has been doing some house cleaning/lifting . Was bent down cleaning out cabinet when she turned and felt grabbing pain in low right back. Painful to standup and change positions. Sore to touch. Was phoned in Mobic and Robaxin. Is some better but meds make her sleepy. Still has right low back pain w/ radiation into buttock. No leg weakness, urinary symptoms or rash. Does feel puffy in her hand when she wakes up in am and tingling in hands and feet in am. No arm or neck pain or weakness.  Has had sciatica pain in past.    Review of Systems Constitutional:   No  weight loss, night sweats,  Fevers, chills, fatigue, or  lassitude.  HEENT:   No headaches,  Difficulty swallowing,  Tooth/dental problems, or  Sore throat,                No sneezing, itching, ear ache, nasal congestion, post nasal drip,   CV:  No chest pain,  Orthopnea, PND, swelling in lower extremities, anasarca, dizziness, palpitations, syncope.   GI  No heartburn, indigestion, abdominal pain, nausea, vomiting, diarrhea, change in bowel habits, loss of appetite, bloody stools.   Resp: No shortness of breath with exertion or at rest.  No excess mucus, no productive cough,  No non-productive cough,  No coughing up of blood.  No change in color of mucus.  No wheezing.  No chest wall deformity  Skin: no rash or lesions.  GU: no dysuria, change in color of urine, no urgency or frequency.  No flank pain, no hematuria   MS:  + joint pain . Neg  swelling.  + decreased range of motion.  + back pain.  Psych:  No change in mood or affect. No depression or anxiety.  No memory loss.           Objective:   Physical Exam GEN: A/Ox3; pleasant , NAD, well nourished   HEENT:  [place]/AT,  EACs-clear, TMs-wnl, NOSE-clear, THROAT-clear, no lesions, no postnasal drip or exudate noted.   NECK:  Supple w/ fair ROM; no JVD; normal carotid impulses w/o bruits; no thyromegaly or nodules palpated; no lymphadenopathy.  RESP  Clear  P & A; w/o, wheezes/ rales/ or rhonchi.no accessory muscle use, no dullness to percussion  CARD:  RRR, no m/r/g  , no peripheral edema, pulses intact, no cyanosis or clubbing.  GI:   Soft & nt; nml bowel sounds; no organomegaly or masses detected.  Musco: Warm bil, no deformities or joint swelling noted.  nml ROM of legs. Pain with rom of back, tender along right sciatic joint. No redness or rash noted.  nml strength bilaterally of legs , neg SLR   Neuro: alert, no focal deficits noted.    Skin: Warm, no lesions or rashes         Assessment & Plan:

## 2011-08-18 ENCOUNTER — Ambulatory Visit (INDEPENDENT_AMBULATORY_CARE_PROVIDER_SITE_OTHER): Payer: 59 | Admitting: Adult Health

## 2011-08-18 ENCOUNTER — Encounter: Payer: Self-pay | Admitting: Adult Health

## 2011-08-18 DIAGNOSIS — G43909 Migraine, unspecified, not intractable, without status migrainosus: Secondary | ICD-10-CM

## 2011-08-18 MED ORDER — HYDROCODONE-ACETAMINOPHEN 5-500 MG PO TABS: 1.0000 | ORAL_TABLET | ORAL | Status: DC | PRN

## 2011-08-18 MED ORDER — PROMETHAZINE HCL 25 MG PO TABS: 25.0000 mg | ORAL_TABLET | Freq: Four times a day (QID) | ORAL | Status: AC | PRN

## 2011-08-18 NOTE — Progress Notes (Signed)
Subjective:    Patient ID: Jody [name], female    DOB: 08-08-1976, 35 y.o.   MRN: [id number]  HPI  35 yo healthy female with known hx of GERD  06/26/11  Acute OV  Pt presents for an acute office visit. Complains of right low back pain x5days with hands and feet tingling - reports happened after twisting while cleaning under bathroom cabinet. Has been doing some house cleaning/lifting . Was bent down cleaning out cabinet when she turned and felt grabbing pain in low right back. Painful to standup and change positions. Sore to touch. Was phoned in Mobic and Robaxin. Is some better but meds make her sleepy. Still has right low back pain w/ radiation into buttock. No leg weakness, urinary symptoms or rash. Does feel puffy in her hand when she wakes up in am and tingling in hands and feet in am. No arm or neck pain or weakness.  Has had sciatica pain in past.  >>tx w/ NSAIDS and Skelaxin  08/18/2011 Acute OV  Pt complains of a migraine headcahe and nausea.. Pain in headache behind the left eye, severe pressure, very sensitive to light, and sound. Very nauseaous. Took 4 aleve with no help Under a lot of stress with job change. Vomitted x 1 . Had massage yesterday. Woke up about 3 am this morning. No visual changes/speech changes , or arm weakness. No fever or neck stiffness. Had had few MHA in past, seen at urgent care w/ tx w/ IM shot.   Review of Systems   Constitutional:   No  weight loss, night sweats,  Fevers, chills, fatigue, or  lassitude.  HEENT:     Difficulty swallowing,  Tooth/dental problems, or  Sore throat,                No sneezing, itching, ear ache, nasal congestion, post nasal drip,   CV:  No chest pain,  Orthopnea, PND, swelling in lower extremities, anasarca, dizziness, palpitations, syncope.   GI  No heartburn, indigestion, abdominal pain, diarrhea, change in bowel habits, loss of appetite, bloody stools.   Resp: No shortness of breath with exertion or at rest.  No  excess mucus, no productive cough,  No non-productive cough,  No coughing up of blood.  No change in color of mucus.  No wheezing.  No chest wall deformity  Skin: no rash or lesions.  GU: no dysuria, change in color of urine, no urgency or frequency.  No flank pain, no hematuria   MS:  No joint pain or swelling.  No decreased range of motion.  No back pain.  Psych:  No change in mood or affect. No depression or anxiety.  No memory loss.  Neuro : no focal deficits noted.            Objective:   Physical Exam  GEN: A/Ox3; pleasant , NAD, well nourished   HEENT:  [place]/AT,  EACs-clear, TMs-wnl, NOSE-clear, THROAT-clear, no lesions, no postnasal drip or exudate noted. PERRlA EOMI w/o nystagmus   NECK:  Supple w/ fair ROM; no JVD; normal carotid impulses w/o bruits; no thyromegaly or nodules palpated; no lymphadenopathy.neg nuchal rigiditiy   RESP  Clear  P & A; w/o, wheezes/ rales/ or rhonchi.no accessory muscle use, no dullness to percussion  CARD:  RRR, no m/r/g  , no peripheral edema, pulses intact, no cyanosis or clubbing.  GI:   Soft & nt; nml bowel sounds; no organomegaly or masses detected.  Musco: Warm bil, no deformities or  joint swelling noted.    Neuro: alert, no focal deficits noted.  CN 2-12 intact equal strength bilaterally, nml grips ,  Coordinating movement nml, nml gait.   Skin: Warm, no lesions or rashes         Assessment & Plan:

## 2011-08-18 NOTE — Patient Instructions (Signed)
Vicodin 1-2 every 6 hr As needed  Pain/headache  Phenergan 25mg  every 6 hr as needed for nausea  Cool compresses , dark room  Stress reducers  If headaches do not improve or continue to return will need to be reevaluated.  Please contact office for sooner follow up if symptoms do not improve or worsen or seek emergency care

## 2011-08-18 NOTE — Assessment & Plan Note (Signed)
Acute MHA secondary to recent stress Will hold on MHA rx for now since seems to be an isolated incidence.  Will need to consider headache log if headaches return or increase in frequency.   Plan;  Vicodin 1-2 every 6 hr As needed  Pain/headache  Phenergan 25mg  every 6 hr as needed for nausea  Cool compresses , dark room  Stress reducers  If headaches do not improve or continue to return will need to be reevaluated.  Please contact office for sooner follow up if symptoms do not improve or worsen or seek emergency care

## 2011-09-11 ENCOUNTER — Telehealth: Payer: Self-pay | Admitting: Pulmonary Disease

## 2011-09-11 MED ORDER — AMOXICILLIN-POT CLAVULANATE 875-125 MG PO TABS: 1.0000 | ORAL_TABLET | Freq: Two times a day (BID) | ORAL | Status: AC

## 2011-09-11 NOTE — Telephone Encounter (Signed)
PT RETURNED CALL. PLEASE CALL BACK BEFORE 1 TODAY IF POSSIBLE. Jody [name]

## 2011-09-11 NOTE — Telephone Encounter (Signed)
lmomtcb  

## 2011-09-11 NOTE — Telephone Encounter (Signed)
Spoke with pt. She is c/o green nasal d/c and some sinus pressure x several days. Cough when she lies down she relates to sinus drainage. She states that she has had no fever, but has had some chills over the weekend. She states that she is taking mucinex, dayquil and nyquil without help. Would like something called in. Please advise, thanks!

## 2011-09-11 NOTE — Telephone Encounter (Signed)
Called and spoke with pt and per SN---ok for pt to have augmentin 875mg   #20  1 po bid  And take the mucinex 2 po bid with plenty of fluids and ok for tylenol cold and sinus otc.  Pt voiced her understanding of this and will call for any other problems or concerns.

## 2011-09-21 ENCOUNTER — Other Ambulatory Visit: Payer: Self-pay | Admitting: *Deleted

## 2011-09-21 MED ORDER — ALPRAZOLAM 0.5 MG PO TABS: 0.5000 mg | ORAL_TABLET | Freq: Every evening | ORAL | Status: DC | PRN

## 2011-11-29 ENCOUNTER — Other Ambulatory Visit: Payer: Self-pay | Admitting: *Deleted

## 2011-11-29 MED ORDER — ESOMEPRAZOLE MAGNESIUM 40 MG PO CPDR: 40.0000 mg | DELAYED_RELEASE_CAPSULE | Freq: Every day | ORAL | Status: DC | PRN

## 2011-12-06 ENCOUNTER — Telehealth: Payer: Self-pay | Admitting: Pulmonary Disease

## 2011-12-06 MED ORDER — ALPRAZOLAM 0.5 MG PO TABS: 0.5000 mg | ORAL_TABLET | Freq: Every evening | ORAL | Status: DC | PRN

## 2011-12-06 NOTE — Telephone Encounter (Signed)
[facility]  Requesting  Alprazolam 0.5 mg  Take 1 tablet by mouth at bedtime as needed for sleep or anxiety . #90 Last fill 09/21/11 Allergies  Allergen Reactions  . Sulfonamide Derivatives     REACTION: rash   Dr Kriste Basque is this ok to fill  Thank you

## 2011-12-28 ENCOUNTER — Telehealth: Payer: Self-pay | Admitting: Pulmonary Disease

## 2011-12-28 NOTE — Telephone Encounter (Signed)
LMOM TCB x1.  Last ov with TP 10.12.12, SN 2.6.12.

## 2011-12-29 MED ORDER — AZITHROMYCIN 250 MG PO TABS: ORAL_TABLET | ORAL | Status: AC

## 2011-12-29 NOTE — Telephone Encounter (Signed)
lmomtcb x1 

## 2011-12-29 NOTE — Telephone Encounter (Signed)
[name] returned call. Call anytime as she is in her car now. Jody [name]

## 2011-12-29 NOTE — Telephone Encounter (Signed)
Pt returned call. Kathleen W Perdue  

## 2011-12-29 NOTE — Telephone Encounter (Signed)
Pt left message that she is on a flight back home and will not be able to answer her phone until after 2pm. Please try her after 2pm today.

## 2011-12-29 NOTE — Telephone Encounter (Signed)
I spoke with pt and she c/o sore throat, nasal congestion, cough w/ green phlem x yesterday. No chills, sweats, nausea, vomiting. Pt is taking mucinex, dayquil, nyquil w/o relief. Pt just came back from [place]. Pt is requesting an abx sent to [facility].  Allergies  Allergen Reactions  . Sulfonamide Derivatives     REACTION: rash       Per TP call in ZPAk #1 take as directed, saline nasal spray, mucinex w/ plenty of fluids, tylenol and if no better then needs to come in for OV  I spoke with pt and advised her of this. She voiced her understanding and had no questions.

## 2011-12-29 NOTE — Telephone Encounter (Signed)
LMOMTCB for [employer]

## 2012-01-04 ENCOUNTER — Other Ambulatory Visit: Payer: Self-pay | Admitting: Adult Health

## 2012-01-04 NOTE — Telephone Encounter (Signed)
Pt is also requesting refill on xanax 0.5mg , take 1 tab qhs prn. Her last refill was on 12-06-11 #90 tablets. Please advise.Carron Curie, CMA

## 2012-01-04 NOTE — Telephone Encounter (Signed)
Leigh, please advise what day of March 10th week we can have her come in. She would like to have Thursday 01-18-12 due to travel and work. Thanks.

## 2012-01-04 NOTE — Telephone Encounter (Signed)
LMTCB

## 2012-01-04 NOTE — Telephone Encounter (Signed)
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

## 2012-01-04 NOTE — Telephone Encounter (Signed)
Per TP: no refills at this time.  Refill requested too soon.  Also, pt is overdue for appt with SN > last ov was 12/2010.  Needs ov to discuss.  Thanks.

## 2012-01-05 NOTE — Telephone Encounter (Signed)
lmomtcb  

## 2012-01-05 NOTE — Telephone Encounter (Signed)
Can offer march 7 at 2pm.  thanks

## 2012-01-05 NOTE — Telephone Encounter (Signed)
Called, spoke with pt.  She will be in epic on 3/7 and cannot come in then.  She would like to come in on March 13 -- can be here right at 3:30 or later, March 14 3 or later, or March 15 3:30 or later.  Leigh, pls advise.  Thanks!

## 2012-01-05 NOTE — Telephone Encounter (Signed)
March 14 at 3.

## 2012-01-08 MED ORDER — FLUCONAZOLE 150 MG PO TABS: 150.0000 mg | ORAL_TABLET | Freq: Once | ORAL | Status: AC

## 2012-01-08 MED ORDER — CEFDINIR 300 MG PO CAPS: 300.0000 mg | ORAL_CAPSULE | Freq: Two times a day (BID) | ORAL | Status: AC

## 2012-01-08 NOTE — Telephone Encounter (Signed)
LMTCBx2. Abx sent to pharmacy, but pt needs appt for xanax refill.  Carron Curie, CMA

## 2012-01-09 NOTE — Telephone Encounter (Signed)
lmomtcb  

## 2012-01-10 NOTE — Telephone Encounter (Signed)
LMOMTCB x 1 

## 2012-01-15 NOTE — Telephone Encounter (Signed)
LMOMTCB x 1 for patient. We have left several msgs for the pt to return our phone call . There have been no return calls and msg will now be closed per protocol.

## 2012-03-13 ENCOUNTER — Encounter: Payer: Self-pay | Admitting: Family

## 2012-03-13 ENCOUNTER — Ambulatory Visit (INDEPENDENT_AMBULATORY_CARE_PROVIDER_SITE_OTHER): Payer: 59 | Admitting: Family

## 2012-03-13 VITALS — BP 116/80 | HR 101 | Temp 98.1°F | Resp 16 | Wt 190.1 lb

## 2012-03-13 DIAGNOSIS — J209 Acute bronchitis, unspecified: Secondary | ICD-10-CM

## 2012-03-13 MED ORDER — AZITHROMYCIN 250 MG PO TABS: ORAL_TABLET | ORAL | Status: AC

## 2012-03-13 MED ORDER — BENZONATATE 100 MG PO CAPS: 100.0000 mg | ORAL_CAPSULE | Freq: Three times a day (TID) | ORAL | Status: AC | PRN

## 2012-03-13 NOTE — Patient Instructions (Signed)

## 2012-03-13 NOTE — Progress Notes (Signed)
Subjective:    Patient ID: Jody [name], female    DOB: 06/20/1976, 36 y.o.   MRN: [id number]  HPI  [name] is a 36 yr old female who presents today with chief complaint of cough.  She reports that cough started several days ago and is described as dry.  She also reports associated hoarseness and sore throat.  Sore throat is described as moderate.  She reports feeling hot/cold. Has tried delsym, mucinex, nyquil/dayquil with no improvement.     Review of Systems See HPI  Past Medical History  Diagnosis Date  . History of pneumonia   . Nonspecific reaction to tuberculin skin test without active tuberculosis   . Hypercholesterolemia   . Borderline diabetes   . GERD (gastroesophageal reflux disease)   . Constipation   . Lumbago   . History of depression     History   Social History  . Marital Status: Single    Spouse Name: N/A    Number of Children: 0  . Years of Education: N/A   Occupational History  . works for [employer]    Social History Main Topics  . Smoking status: Never Smoker   . Smokeless tobacco: Not on file  . Alcohol Use: Yes     occasional  . Drug Use: Not on file  . Sexually Active: Not on file   Other Topics Concern  . Not on file   Social History Narrative   Separated, but in a relationship    Past Surgical History  Procedure Date  . Breast reduction surgery 1995  . Appendectomy age 19  . Tonsillectomy age 51    Family History  Problem Relation Age of Onset  . Emphysema Maternal Grandfather   . Emphysema Maternal Grandfather   . COPD Maternal Grandfather   . COPD Maternal Grandfather   . Allergies Sister   . Asthma Sister   . Heart disease Father   . Heart disease Maternal Grandmother   . Clotting disorder Maternal Grandmother   . Rheum arthritis Maternal Grandmother   . Breast cancer Maternal Aunt   . Diabetes Father     Allergies  Allergen Reactions  . Sulfonamide Derivatives     REACTION: rash    Current  Outpatient Prescriptions on File Prior to Visit  Medication Sig Dispense Refill  . esomeprazole (NEXIUM) 40 MG capsule Take 1 capsule (40 mg total) by mouth daily as needed.  30 capsule  3  . Naproxen Sodium (ALEVE PO) Take 200 mg by mouth as needed.        . ALPRAZolam (XANAX) 0.5 MG tablet Take 1 tablet (0.5 mg total) by mouth at bedtime as needed for sleep or anxiety.  90 tablet  0  . HYDROcodone-acetaminophen (VICODIN) 5-500 MG per tablet Take 1 tablet by mouth every 4 (four) hours as needed for pain.  20 tablet  0  . meloxicam (MOBIC) 7.5 MG tablet Take 1 tablet (7.5 mg total) by mouth daily.  30 tablet  2    BP 116/80  Pulse 101  Temp(Src) 98.1 F (36.7 C) (Oral)  Resp 16  Wt 190 lb 1.3 oz (86.22 kg)  SpO2 99%       Objective:   Physical Exam  Constitutional: She appears well-developed and well-nourished. No distress.  HENT:  Head: Normocephalic and atraumatic.  Right Ear: Tympanic membrane and ear canal normal.  Left Ear: Tympanic membrane and ear canal normal.  Mouth/Throat: No oropharyngeal exudate, posterior oropharyngeal edema or posterior  oropharyngeal erythema.  Cardiovascular: Normal rate and regular rhythm.   No murmur heard. Pulmonary/Chest: Effort normal and breath sounds normal. No respiratory distress. She has no wheezes. She has no rales. She exhibits no tenderness.  Musculoskeletal: She exhibits no edema.  Lymphadenopathy:    She has no cervical adenopathy.  Skin: Skin is warm and dry.  Psychiatric: She has a normal mood and affect. Her behavior is normal. Judgment and thought content normal.          Assessment & Plan:

## 2012-03-15 NOTE — Assessment & Plan Note (Signed)
Plan to treat for bronchitis with z-pak. Add tessalon prn. Call if symptoms worsen or if no improvement in 2-3 days.

## 2012-03-21 ENCOUNTER — Encounter: Payer: Self-pay | Admitting: Family Medicine

## 2012-03-21 ENCOUNTER — Ambulatory Visit (INDEPENDENT_AMBULATORY_CARE_PROVIDER_SITE_OTHER): Payer: 59 | Admitting: Family Medicine

## 2012-03-21 VITALS — BP 132/88 | HR 84 | Temp 99.1°F | Ht 64.0 in | Wt 190.8 lb

## 2012-03-21 DIAGNOSIS — F3289 Other specified depressive episodes: Secondary | ICD-10-CM

## 2012-03-21 DIAGNOSIS — F329 Major depressive disorder, single episode, unspecified: Secondary | ICD-10-CM

## 2012-03-21 DIAGNOSIS — Z0000 Encounter for general adult medical examination without abnormal findings: Secondary | ICD-10-CM

## 2012-03-21 DIAGNOSIS — K219 Gastro-esophageal reflux disease without esophagitis: Secondary | ICD-10-CM

## 2012-03-21 DIAGNOSIS — E7800 Pure hypercholesterolemia, unspecified: Secondary | ICD-10-CM

## 2012-03-21 DIAGNOSIS — R7309 Other abnormal glucose: Secondary | ICD-10-CM

## 2012-03-21 LAB — BASIC METABOLIC PANEL
CO2: 25 mEq/L (ref 19–32)
Chloride: 102 mEq/L (ref 96–112)
Glucose, Bld: 75 mg/dL (ref 70–99)
Potassium: 4 mEq/L (ref 3.5–5.1)
Sodium: 137 mEq/L (ref 135–145)

## 2012-03-21 LAB — HEPATIC FUNCTION PANEL
ALT: 31 U/L (ref 0–35)
AST: 23 U/L (ref 0–37)
Albumin: 4.5 g/dL (ref 3.5–5.2)
Alkaline Phosphatase: 49 U/L (ref 39–117)
Total Protein: 7.4 g/dL (ref 6.0–8.3)

## 2012-03-21 LAB — CBC WITH DIFFERENTIAL/PLATELET
Eosinophils Relative: 0.4 % (ref 0.0–5.0)
Monocytes Relative: 6.3 % (ref 3.0–12.0)
Neutrophils Relative %: 56.1 % (ref 43.0–77.0)
Platelets: 267 10*3/uL (ref 150.0–400.0)
RBC: 4.68 Mil/uL (ref 3.87–5.11)
WBC: 8.6 10*3/uL (ref 4.5–10.5)

## 2012-03-21 LAB — HEMOGLOBIN A1C: Hgb A1c MFr Bld: 5.7 % (ref 4.6–6.5)

## 2012-03-21 LAB — LIPID PANEL
HDL: 41.1 mg/dL (ref >39.00)
Total CHOL/HDL Ratio: 4

## 2012-03-21 MED ORDER — ALPRAZOLAM 0.5 MG PO TABS: 0.5000 mg | ORAL_TABLET | Freq: Every evening | ORAL | Status: DC | PRN

## 2012-03-21 MED ORDER — VENLAFAXINE HCL ER 37.5 MG PO CP24: 37.5000 mg | ORAL_CAPSULE | Freq: Every day | ORAL | Status: DC

## 2012-03-21 NOTE — Progress Notes (Signed)
  Subjective:    Patient ID: Jody [name], female    DOB: Dec 21, 1975, 36 y.o.   MRN: [id number]  HPI Hyperlipidemia- chronic problem.  Has not had recent labs.  Not currently on meds.  GERD- chronic problem, on Nexium PRN w/ good control.  Weight gain- has gained 50 lbs over last 2 yrs, 30+ in last year due to stressful work environment.  No exercising due to 60-70 hr work weeks.  Does not eat breakfast, will miss lunch, will get take out/fast food for dinner.  Depression- recurrent problem.  Was on Zoloft and Wellbutrin during her divorce.  Weaned of meds.  Needs refill on xanax- take nightly for sleep.   Review of Systems For ROS see HPI     Objective:   Physical Exam  Vitals reviewed. Constitutional: She is oriented to person, place, and time. She appears well-developed and well-nourished. No distress.       overwt  HENT:  Head: Normocephalic and atraumatic.  Eyes: Conjunctivae and EOM are normal. Pupils are equal, round, and reactive to light.  Neck: Normal range of motion. Neck supple. No thyromegaly present.  Cardiovascular: Normal rate, regular rhythm, normal heart sounds and intact distal pulses.   No murmur heard. Pulmonary/Chest: Effort normal and breath sounds normal. No respiratory distress.  Abdominal: Soft. She exhibits no distension. There is no tenderness.  Musculoskeletal: She exhibits no edema.  Lymphadenopathy:    She has no cervical adenopathy.  Neurological: She is alert and oriented to person, place, and time.  Skin: Skin is warm and dry.  Psychiatric: She has a normal mood and affect. Her behavior is normal.       Rapid, nearly pressured speech          Assessment & Plan:

## 2012-03-21 NOTE — Patient Instructions (Signed)
Follow up in 1 month to recheck mood Start the Effexor daily Continue the xanax nightly We'll notify you of your lab results Check out weight watchers online Call with any questions or concerns Welcome!  We're glad to have you! Hang in there!!!

## 2012-03-24 LAB — VITAMIN D 1,25 DIHYDROXY
Vitamin D 1, 25 (OH)2 Total: 53 pg/mL (ref 18–72)
Vitamin D2 1, 25 (OH)2: <8 pg/mL

## 2012-03-24 NOTE — Assessment & Plan Note (Signed)
New to provider.  Pt was told this a few years ago and goal was attempting to control w/ diet and exercise.  Pt has subsequently gained 50lbs.  Check labs to ensure pt is not now diabetic.  Stressed importance of healthy diet and regular exercise.

## 2012-03-24 NOTE — Assessment & Plan Note (Signed)
New to provider, pt was told this years ago but was never started on meds.  Will check labs and determine whether meds are required.

## 2012-03-24 NOTE — Assessment & Plan Note (Signed)
New to provider, chronic for pt.  Well controlled on Nexium.  No changes.

## 2012-03-24 NOTE — Assessment & Plan Note (Signed)
Deteriorated.  Previously on Zoloft and Wellbutrin.  Pt concerned about weight gain and fatigue (previous side effects).  Start low dose effexor and monitor sxs closely.

## 2012-03-25 ENCOUNTER — Encounter: Payer: Self-pay | Admitting: *Deleted

## 2012-04-15 ENCOUNTER — Telehealth: Payer: Self-pay | Admitting: Family Medicine

## 2012-04-15 NOTE — Telephone Encounter (Signed)
Ok to increase effexor to 75 mg daily Pt was given xanax script w/ 3 refills- should not require more meds.  But can call pharmacy to clarify BID dosing which is why she needs early refill.

## 2012-04-15 NOTE — Telephone Encounter (Signed)
Patient came in stating she is taking the effexor 2 times a day with a better result. Patient will need a refill prior to her follow up app on 6.17  Patient also noted she has also had to take more than the prescribed dosage of xanax to sleep, can we also send this for refill as well with either a higher dosage or to be taken more than once daily   Patient would like it sent to [facility] - [place], [place] - [address]

## 2012-04-15 NOTE — Telephone Encounter (Signed)
Please review note last OV 03-21-12 last refill for xanax also on 03-21-12 #30 with 3 refills

## 2012-04-16 MED ORDER — ALPRAZOLAM 0.5 MG PO TABS: 0.5000 mg | ORAL_TABLET | Freq: Two times a day (BID) | ORAL | Status: DC

## 2012-04-16 MED ORDER — VENLAFAXINE HCL ER 37.5 MG PO CP24: 75.0000 mg | ORAL_CAPSULE | Freq: Every day | ORAL | Status: DC

## 2012-04-16 NOTE — Telephone Encounter (Signed)
Noted thanks °

## 2012-04-16 NOTE — Telephone Encounter (Signed)
Has she filled all 3 refills?  If she is out of meds and doesn't want to pick up the refill for #30- pharmacy needs to void refills and new script can be sent for #60 w/ BID instructions, 1 refill

## 2012-04-16 NOTE — Telephone Encounter (Signed)
ZOX:WRUEAV [facility] pharmacy per pt first refill was noted at this facility and [facility] noted pt has only picked up #30 on 03-21-12, gave verbal order to void remaining refills and change to new RX for Xanax 0.5mg  BID #60 with 1 refill, ben advised that he will void the remaining refills and placed new order for RX, also advised change for Effexor 75mg  daily #30 with 2 refills also noted this needs to be sent to [facility] per pt request, ben noted he will send this to [facility] for the pt

## 2012-04-16 NOTE — Telephone Encounter (Signed)
Called pharmacy and was advised that the directions note taking 0.5mg  tablet at bedtime, noted ONE tablet only, please advise if more can be sent

## 2012-04-22 ENCOUNTER — Encounter: Payer: Self-pay | Admitting: Family Medicine

## 2012-04-22 ENCOUNTER — Ambulatory Visit (INDEPENDENT_AMBULATORY_CARE_PROVIDER_SITE_OTHER): Payer: 59 | Admitting: Family Medicine

## 2012-04-22 VITALS — BP 127/80 | HR 96 | Temp 98.9°F | Ht 64.25 in | Wt 191.8 lb

## 2012-04-22 DIAGNOSIS — F3289 Other specified depressive episodes: Secondary | ICD-10-CM

## 2012-04-22 DIAGNOSIS — F329 Major depressive disorder, single episode, unspecified: Secondary | ICD-10-CM

## 2012-04-22 NOTE — Progress Notes (Signed)
  Subjective:    Patient ID: Jody [name], female    DOB: 09/25/1976, 36 y.o.   MRN: [id number]  HPI Depression- has increased Effexor to 75 mg.  Has been on vacation so mood has improved.  Will resume working tomorrow to test new dose.  Taking xanax nightly- was taking 1 tab plus tylenol PM for sleep.  Has increased to 2 xanax nightly for sleep.   Review of Systems For ROS see HPI     Objective:   Physical Exam  Vitals reviewed. Constitutional: She appears well-developed and well-nourished. No distress.  Psychiatric: She has a normal mood and affect. Her behavior is normal. Judgment and thought content normal.          Assessment & Plan:

## 2012-04-22 NOTE — Patient Instructions (Addendum)
Follow up as scheduled for the physical Continue the 75mg  Effexor daily Call w/ any questions or concerns Have a great summer!!!!

## 2012-04-22 NOTE — Assessment & Plan Note (Signed)
Chronic problem.  Feels mood has improved since doubling Effexor to 75mg .  Taking xanax for sleep.  No changes at this time.  Will follow closely.

## 2012-05-20 ENCOUNTER — Encounter: Payer: Self-pay | Admitting: Family Medicine

## 2012-05-20 ENCOUNTER — Ambulatory Visit (INDEPENDENT_AMBULATORY_CARE_PROVIDER_SITE_OTHER): Payer: 59 | Admitting: Family Medicine

## 2012-05-20 VITALS — BP 126/78 | HR 100 | Temp 98.7°F | Ht 64.0 in | Wt 194.4 lb

## 2012-05-20 DIAGNOSIS — N926 Irregular menstruation, unspecified: Secondary | ICD-10-CM

## 2012-05-20 NOTE — Patient Instructions (Addendum)
You are 9 3/[redacted] weeks pregnant (LMP 5/10) Due date 12/20/12!!! Prenatal vitamin daily REST! Call your OB! CONGRATS!!!

## 2012-05-20 NOTE — Progress Notes (Signed)
  Subjective:    Patient ID: Jody [name], female    DOB: 07-28-1976, 36 y.o.   MRN: [id number]  HPI Amenorrhea- LMP 5/10.  + home test x2.  Has been trying 'for awhile'.  Has OB/GYN.  Taking OTC prenatal vitamins.  Stopped other meds.  Is 9 3/7 weeks, due 2/14.   Review of Systems For ROS see HPI     Objective:   Physical Exam  Vitals reviewed. Constitutional: She appears well-developed and well-nourished. No distress.  Skin: Skin is warm and dry.  Psychiatric: She has a normal mood and affect. Her behavior is normal.       Very excited          Assessment & Plan:

## 2012-05-20 NOTE — Assessment & Plan Note (Signed)
Pt w/ positive upreg in office- confirming pregnancy.  Encouraged pt to f/u w/ OB ASAP as she is already 9+ weeks.  Pt taking daily PNV, off meds.  Doing well and very excited.

## 2012-05-28 LAB — OB RESULTS CONSOLE GC/CHLAMYDIA: Chlamydia: NEGATIVE

## 2012-05-28 LAB — OB RESULTS CONSOLE RPR: RPR: NONREACTIVE

## 2012-05-28 LAB — OB RESULTS CONSOLE ABO/RH: RH Type: POSITIVE

## 2012-06-10 ENCOUNTER — Other Ambulatory Visit: Payer: Self-pay | Admitting: Family Medicine

## 2012-06-10 MED ORDER — ESOMEPRAZOLE MAGNESIUM 40 MG PO CPDR: 40.0000 mg | DELAYED_RELEASE_CAPSULE | Freq: Every day | ORAL | Status: DC | PRN

## 2012-06-10 NOTE — Telephone Encounter (Signed)
refill NEXIUM 40 MG Take 1 capsule (40 mg total) by mouth daily as needed. Doesn't need til 8.16.13 as she is out of town but will be out when she returns Send to [facility] - [place], [place] - [address]  Last ov 7.15.13

## 2012-06-10 NOTE — Telephone Encounter (Signed)
rx sent to pharmacy by e-script  

## 2012-06-12 ENCOUNTER — Encounter: Payer: 59 | Admitting: Family Medicine

## 2012-12-20 ENCOUNTER — Inpatient Hospital Stay (HOSPITAL_COMMUNITY): Payer: 59 | Admitting: Anesthesiology

## 2012-12-20 ENCOUNTER — Inpatient Hospital Stay (HOSPITAL_COMMUNITY)
Admission: AD | Admit: 2012-12-20 | Discharge: 2012-12-22 | DRG: 775 | Disposition: A | Discharge status: Home / Self Care | Payer: 59 | Source: Ambulatory Visit | Attending: Obstetrics and Gynecology | Admitting: Obstetrics and Gynecology

## 2012-12-20 ENCOUNTER — Encounter (HOSPITAL_COMMUNITY): Payer: Self-pay | Admitting: Anesthesiology

## 2012-12-20 ENCOUNTER — Encounter (HOSPITAL_COMMUNITY): Payer: Self-pay

## 2012-12-20 DIAGNOSIS — O09529 Supervision of elderly multigravida, unspecified trimester: Secondary | ICD-10-CM | POA: Diagnosis present

## 2012-12-20 DIAGNOSIS — O4290 Premature rupture of membranes, unspecified as to length of time between rupture and onset of labor, unspecified weeks of gestation: Principal | ICD-10-CM | POA: Diagnosis present

## 2012-12-20 LAB — CBC
MCH: 29.5 pg (ref 26.0–34.0)
MCHC: 34 g/dL (ref 30.0–36.0)
MCV: 86.8 fL (ref 78.0–100.0)
Platelets: 209 10*3/uL (ref 150–400)
RDW: 13.8 % (ref 11.5–15.5)

## 2012-12-20 MED ORDER — IBUPROFEN 600 MG PO TABS: 600.0000 mg | ORAL_TABLET | Freq: Four times a day (QID) | ORAL | Status: DC | PRN

## 2012-12-20 MED ORDER — CITRIC ACID-SODIUM CITRATE 334-500 MG/5ML PO SOLN: 30.0000 mL | ORAL | Status: DC | PRN

## 2012-12-20 MED ORDER — OXYCODONE-ACETAMINOPHEN 5-325 MG PO TABS: 1.0000 | ORAL_TABLET | ORAL | Status: DC | PRN

## 2012-12-20 MED ORDER — LACTATED RINGERS IV SOLN: 500.0000 mL | Freq: Once | INTRAVENOUS | Status: DC

## 2012-12-20 MED ORDER — FLEET ENEMA 7-19 GM/118ML RE ENEM: 1.0000 | ENEMA | RECTAL | Status: DC | PRN

## 2012-12-20 MED ORDER — LIDOCAINE HCL (PF) 1 % IJ SOLN
30.0000 mL | INTRAMUSCULAR | Status: DC | PRN
  Administered 2012-12-20: 30 mL via SUBCUTANEOUS
  Filled 2012-12-20 (×2): qty 30

## 2012-12-20 MED ORDER — BUTORPHANOL TARTRATE 1 MG/ML IJ SOLN: 1.0000 mg | INTRAMUSCULAR | Status: DC | PRN

## 2012-12-20 MED ORDER — OXYTOCIN BOLUS FROM INFUSION
500.0000 mL | INTRAVENOUS | Status: DC
  Administered 2012-12-20: 500 mL via INTRAVENOUS

## 2012-12-20 MED ORDER — FENTANYL 2.5 MCG/ML BUPIVACAINE 1/10 % EPIDURAL INFUSION (WH - ANES)
14.0000 mL/h | INTRAMUSCULAR | Status: DC
  Administered 2012-12-20: 14 mL/h via EPIDURAL
  Filled 2012-12-20: qty 125

## 2012-12-20 MED ORDER — LACTATED RINGERS IV SOLN: 500.0000 mL | INTRAVENOUS | Status: DC | PRN

## 2012-12-20 MED ORDER — ONDANSETRON HCL 4 MG/2ML IJ SOLN: 4.0000 mg | Freq: Four times a day (QID) | INTRAMUSCULAR | Status: DC | PRN

## 2012-12-20 MED ORDER — EPHEDRINE 5 MG/ML INJ
10.0000 mg | INTRAVENOUS | Status: DC | PRN
  Filled 2012-12-20: qty 4

## 2012-12-20 MED ORDER — PHENYLEPHRINE 40 MCG/ML (10ML) SYRINGE FOR IV PUSH (FOR BLOOD PRESSURE SUPPORT)
80.0000 ug | PREFILLED_SYRINGE | INTRAVENOUS | Status: DC | PRN
  Filled 2012-12-20: qty 5

## 2012-12-20 MED ORDER — EPHEDRINE 5 MG/ML INJ: 10.0000 mg | INTRAVENOUS | Status: DC | PRN

## 2012-12-20 MED ORDER — PHENYLEPHRINE 40 MCG/ML (10ML) SYRINGE FOR IV PUSH (FOR BLOOD PRESSURE SUPPORT)
80.0000 ug | PREFILLED_SYRINGE | INTRAVENOUS | Status: DC | PRN
  Administered 2012-12-20: 80 ug via INTRAVENOUS

## 2012-12-20 MED ORDER — OXYTOCIN 40 UNITS IN LACTATED RINGERS INFUSION - SIMPLE MED
62.5000 mL/h | INTRAVENOUS | Status: DC
  Administered 2012-12-20: 62.5 mL/h via INTRAVENOUS

## 2012-12-20 MED ORDER — SODIUM BICARBONATE 8.4 % IV SOLN
INTRAVENOUS | Status: DC | PRN
  Administered 2012-12-20: 5 mL via EPIDURAL

## 2012-12-20 MED ORDER — TERBUTALINE SULFATE 1 MG/ML IJ SOLN: 0.2500 mg | Freq: Once | INTRAMUSCULAR | Status: DC | PRN

## 2012-12-20 MED ORDER — DIPHENHYDRAMINE HCL 50 MG/ML IJ SOLN: 12.5000 mg | INTRAMUSCULAR | Status: DC | PRN

## 2012-12-20 MED ORDER — LACTATED RINGERS IV SOLN
INTRAVENOUS | Status: DC
  Administered 2012-12-20 (×2): via INTRAVENOUS

## 2012-12-20 MED ORDER — OXYTOCIN 40 UNITS IN LACTATED RINGERS INFUSION - SIMPLE MED
1.0000 m[IU]/min | INTRAVENOUS | Status: DC
  Administered 2012-12-20: 2 m[IU]/min via INTRAVENOUS
  Filled 2012-12-20: qty 1000

## 2012-12-20 MED ORDER — ACETAMINOPHEN 325 MG PO TABS: 650.0000 mg | ORAL_TABLET | ORAL | Status: DC | PRN

## 2012-12-20 NOTE — H&P (Signed)
Jody [name] is a 37 y.o. female presenting for PROM this am. Few UCs. No HA, no vision change, no epigastric pain. Maternal Medical History:  Reason for admission: Rupture of membranes.   Contractions: Onset was 3-5 hours ago.   Frequency: rare.   Perceived severity is mild.    Fetal activity: Perceived fetal activity is normal.      OB History   Grav Para Term Preterm Abortions TAB SAB Ect Mult Living   3    2  2    0     Past Medical History  Diagnosis Date  . History of pneumonia   . Nonspecific reaction to tuberculin skin test without active tuberculosis   . Hypercholesterolemia   . Borderline diabetes   . GERD (gastroesophageal reflux disease)   . Constipation   . Lumbago   . History of depression   . Depression   . Diabetes mellitus   . Migraine    Past Surgical History  Procedure Laterality Date  . Breast reduction surgery  1995  . Appendectomy  age 5  . Tonsillectomy  age 77   Family History: family history includes Allergies in her sister; Arthritis in her mother; Asthma in her sister; Breast cancer in her maternal aunt; COPD in her maternal grandfather; Cancer in her maternal aunt and paternal grandfather; Clotting disorder in her maternal grandmother; Diabetes in her father and mother; Emphysema in her maternal grandfather; Heart disease in her father, maternal grandfather, maternal grandmother, paternal grandfather, and paternal grandmother; Hyperlipidemia in her mother; Hypertension in her mother; Mental illness in her mother; and Rheum arthritis in her maternal grandmother. Social History:  reports that she has never smoked. She does not have any smokeless tobacco history on file. She reports that  drinks alcohol. She reports that she does not use illicit drugs.   Prenatal Transfer Tool  Maternal Diabetes: No Genetic Screening: Normal Maternal Ultrasounds/Referrals: Normal Fetal Ultrasounds or other Referrals:  None Maternal Substance Abuse:   No Significant Maternal Medications:  None Significant Maternal Lab Results:  None Other Comments:  None  Review of Systems  Eyes: Negative for blurred vision.  Gastrointestinal: Negative for abdominal pain.  Neurological: Negative for headaches.    Dilation: Closed Effacement (%): 70 Station: -3 Exam by:: Lailah Marcelli Blood pressure 125/83, pulse 135, temperature 98.4 F (36.9 C), temperature source Oral, resp. rate 18, last menstrual period 03/15/2012. Maternal Exam:  Abdomen: Fetal presentation: vertex     Fetal Exam Fetal Monitor Review: Pattern: accelerations present.       Physical Exam  Cardiovascular: Normal rate and regular rhythm.   Respiratory: Effort normal and breath sounds normal.  GI: There is no tenderness.  Neurological: She has normal reflexes.  bedside U/S vtx  Prenatal labs: ABO, Rh:   Antibody:   Rubella:   RPR:    HBsAg:    HIV:    GBS:     Assessment/Plan: 37 yo G3P0 at 41 6/7 weeks with PROM D/W patient progression of labor and above, D/W pitocin and risks Pitocin augmentation ordered   Jody [name],Jody [name] 12/20/2012, 8:49 AM

## 2012-12-20 NOTE — MAU Note (Signed)
Dr. Henderson Cloud to put in orders.

## 2012-12-20 NOTE — Anesthesia Procedure Notes (Signed)

## 2012-12-20 NOTE — MAU Note (Signed)
Pt states she had a couple of large gushes of clear fluid this a.m.  Seems more like mucus now.  Pt denies uc's or bleeding.

## 2012-12-20 NOTE — Anesthesia Preprocedure Evaluation (Addendum)
Anesthesia Evaluation  Patient identified by MRN, date of birth, ID band Patient awake    Reviewed: Allergy & Precautions, H&P , Patient's Chart, lab work & pertinent test results  Airway Mallampati: II TM Distance: >3 FB Neck ROM: full    Dental  (+) Teeth Intact   Pulmonary  breath sounds clear to auscultation  - decreased breath sounds  - rales    Cardiovascular Rhythm:regular Rate:Normal     Neuro/Psych    GI/Hepatic Medicated,  Endo/Other    Renal/GU      Musculoskeletal   Abdominal   Peds  Hematology   Anesthesia Other Findings       Reproductive/Obstetrics (+) Pregnancy                          Anesthesia Physical Anesthesia Plan  ASA: III  Anesthesia Plan: Epidural   Post-op Pain Management:    Induction:   Airway Management Planned:   Additional Equipment:   Intra-op Plan:   Post-operative Plan:   Informed Consent: I have reviewed the patients History and Physical, chart, labs and discussed the procedure including the risks, benefits and alternatives for the proposed anesthesia with the patient or authorized representative who has indicated his/her understanding and acceptance.   Dental Advisory Given  Plan Discussed with:   Anesthesia Plan Comments: (Labs checked- platelets confirmed with RN in room. Fetal heart tracing, per RN, reported to be stable enough for sitting procedure. Discussed epidural, and patient consents to the procedure:  included risk of possible headache,backache, failed block, allergic reaction, and nerve injury. This patient was asked if she had any questions or concerns before the procedure started. )        Anesthesia Quick Evaluation

## 2012-12-20 NOTE — Progress Notes (Signed)
Cx 4-5/C/0 IUPC placed FHT some variables after epidural treated, now accels

## 2012-12-20 NOTE — Progress Notes (Signed)
FHT reactive UCs about q3 min, difficult to trace Cx 3/C/-1 Unable to pass IUPC secondary to well applied vertex and patient discomfort D/W patient options-will proceed with epidural

## 2012-12-20 NOTE — Progress Notes (Signed)
Delivery Note At 8:32 PM a viable female was delivered via  (Presentation:OA ;  ).  APGAR: 7, 9; weight 7lb, 112 oz .   Placenta status:intact , .  Cord:3 vessels  with the following complications:coiled in front of chest delivered with head .  Cord pH: pending  Anesthesia:epidural   Episiotomy: second degree MLE repaired Lacerations:  Suture Repair: vicryl rapide Est. Blood Loss (mL): 400  Mom to postpartum.  Baby to nursery-stable.  Miken Stecher II,Vanice Rappa E 12/20/2012, 8:47 PM

## 2012-12-20 NOTE — Progress Notes (Signed)
Cx 7-8/C/0  cx displaced toward patient left FHT reactive, some variable decels  UCs q2-3 min

## 2012-12-21 LAB — CBC
HCT: 34.2 % — ABNORMAL LOW (ref 36.0–46.0)
MCH: 29.4 pg (ref 26.0–34.0)
MCHC: 33.6 g/dL (ref 30.0–36.0)
MCV: 87.5 fL (ref 78.0–100.0)
RDW: 13.9 % (ref 11.5–15.5)

## 2012-12-21 MED ORDER — ZOLPIDEM TARTRATE 5 MG PO TABS: 5.0000 mg | ORAL_TABLET | Freq: Every evening | ORAL | Status: DC | PRN

## 2012-12-21 MED ORDER — DIPHENHYDRAMINE HCL 25 MG PO CAPS: 25.0000 mg | ORAL_CAPSULE | Freq: Four times a day (QID) | ORAL | Status: DC | PRN

## 2012-12-21 MED ORDER — BISACODYL 10 MG RE SUPP: 10.0000 mg | Freq: Every day | RECTAL | Status: DC | PRN

## 2012-12-21 MED ORDER — FLEET ENEMA 7-19 GM/118ML RE ENEM: 1.0000 | ENEMA | Freq: Every day | RECTAL | Status: DC | PRN

## 2012-12-21 MED ORDER — TETANUS-DIPHTH-ACELL PERTUSSIS 5-2.5-18.5 LF-MCG/0.5 IM SUSP: 0.5000 mL | Freq: Once | INTRAMUSCULAR | Status: DC

## 2012-12-21 MED ORDER — BENZOCAINE-MENTHOL 20-0.5 % EX AERO
1.0000 "application " | INHALATION_SPRAY | CUTANEOUS | Status: DC | PRN
  Administered 2012-12-21: 1 via TOPICAL
  Filled 2012-12-21: qty 56

## 2012-12-21 MED ORDER — ONDANSETRON HCL 4 MG/2ML IJ SOLN: 4.0000 mg | INTRAMUSCULAR | Status: DC | PRN

## 2012-12-21 MED ORDER — ONDANSETRON HCL 4 MG PO TABS: 4.0000 mg | ORAL_TABLET | ORAL | Status: DC | PRN

## 2012-12-21 MED ORDER — SIMETHICONE 80 MG PO CHEW: 80.0000 mg | CHEWABLE_TABLET | ORAL | Status: DC | PRN

## 2012-12-21 MED ORDER — LANOLIN HYDROUS EX OINT: TOPICAL_OINTMENT | CUTANEOUS | Status: DC | PRN

## 2012-12-21 MED ORDER — SENNOSIDES-DOCUSATE SODIUM 8.6-50 MG PO TABS
2.0000 | ORAL_TABLET | Freq: Every day | ORAL | Status: DC
  Administered 2012-12-21: 2 via ORAL

## 2012-12-21 MED ORDER — PANTOPRAZOLE SODIUM 40 MG PO TBEC
40.0000 mg | DELAYED_RELEASE_TABLET | Freq: Every day | ORAL | Status: DC
  Filled 2012-12-21 (×3): qty 1

## 2012-12-21 MED ORDER — IBUPROFEN 600 MG PO TABS
600.0000 mg | ORAL_TABLET | Freq: Four times a day (QID) | ORAL | Status: DC
  Administered 2012-12-21 – 2012-12-22 (×6): 600 mg via ORAL
  Filled 2012-12-21 (×6): qty 1

## 2012-12-21 MED ORDER — DIBUCAINE 1 % RE OINT: 1.0000 "application " | TOPICAL_OINTMENT | RECTAL | Status: DC | PRN

## 2012-12-21 MED ORDER — OXYCODONE-ACETAMINOPHEN 5-325 MG PO TABS
1.0000 | ORAL_TABLET | ORAL | Status: DC | PRN
  Administered 2012-12-22: 1 via ORAL
  Filled 2012-12-21: qty 1

## 2012-12-21 MED ORDER — WITCH HAZEL-GLYCERIN EX PADS: 1.0000 "application " | MEDICATED_PAD | CUTANEOUS | Status: DC | PRN

## 2012-12-21 MED ORDER — PRENATAL MULTIVITAMIN CH
1.0000 | ORAL_TABLET | Freq: Every day | ORAL | Status: DC
  Administered 2012-12-21: 1 via ORAL
  Filled 2012-12-21: qty 1

## 2012-12-21 NOTE — Progress Notes (Signed)
Post Partum Day 1 Subjective: no complaints  Objective: Blood pressure 132/79, pulse 109, temperature 97.9 F (36.6 C), temperature source Oral, resp. rate 18, height 5\' 4"  (1.626 m), weight 216 lb (97.977 kg), last menstrual period 03/15/2012, SpO2 100.00%, unknown if currently breastfeeding.  Physical Exam:  General: alert, cooperative and no distress Lochia: appropriate Uterine Fundus: firm Incision: healing well DVT Evaluation: No evidence of DVT seen on physical exam.   Recent Labs  12/20/12 0845 12/21/12 0541  HGB 13.6 11.5*  HCT 40.0 34.2*    Assessment/Plan: Plan for discharge tomorrow   LOS: 1 day   Janson Lamar II,Maxx Pham E 12/21/2012, 9:33 AM

## 2012-12-21 NOTE — Anesthesia Postprocedure Evaluation (Signed)
  Anesthesia Post-op Note  Patient: Geneticist, molecular  Procedure(s) Performed: * No procedures listed *  Patient Location: PACU and Mother/Baby  Anesthesia Type:Epidural  Level of Consciousness: awake, alert  and oriented  Airway and Oxygen Therapy: Patient Spontanous Breathing  Post-op Pain: mild  Post-op Assessment: Patient's Cardiovascular Status Stable, Respiratory Function Stable, No signs of Nausea or vomiting, Adequate PO intake and Pain level controlled  Post-op Vital Signs: stable  Complications: No apparent anesthesia complications
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
## 2012-12-22 ENCOUNTER — Encounter (HOSPITAL_COMMUNITY)
Admission: RE | Admit: 2012-12-22 | Discharge: 2012-12-22 | Disposition: A | Discharge status: Home / Self Care | Payer: 59 | Source: Ambulatory Visit | Attending: Obstetrics and Gynecology | Admitting: Obstetrics and Gynecology

## 2012-12-22 DIAGNOSIS — O923 Agalactia: Secondary | ICD-10-CM | POA: Insufficient documentation

## 2012-12-22 MED ORDER — BENZOCAINE-MENTHOL 20-0.5 % EX AERO: 1.0000 "application " | INHALATION_SPRAY | CUTANEOUS | Status: DC | PRN

## 2012-12-22 MED ORDER — PRENATAL MULTIVITAMIN CH: 1.0000 | ORAL_TABLET | Freq: Every day | ORAL | Status: DC

## 2012-12-22 MED ORDER — OXYCODONE-ACETAMINOPHEN 5-325 MG PO TABS: 1.0000 | ORAL_TABLET | Freq: Four times a day (QID) | ORAL | Status: DC | PRN

## 2012-12-22 MED ORDER — IBUPROFEN 600 MG PO TABS: 600.0000 mg | ORAL_TABLET | Freq: Four times a day (QID) | ORAL | Status: DC | PRN

## 2012-12-22 NOTE — Progress Notes (Signed)
Post Partum Day 2 Subjective: no complaints  Objective: Blood pressure 126/79, pulse 112, temperature 97.9 F (36.6 C), temperature source Oral, resp. rate 20, height 5\' 4"  (1.626 m), weight 216 lb (97.977 kg), last menstrual period 03/15/2012, SpO2 100.00%, unknown if currently breastfeeding.  Physical Exam:  General: alert, cooperative and no distress Lochia: appropriate Uterine Fundus: firm Incision: healing well DVT Evaluation: No evidence of DVT seen on physical exam.   Recent Labs  12/20/12 0845 12/21/12 0541  HGB 13.6 11.5*  HCT 40.0 34.2*    Assessment/Plan: Discharge home   LOS: 2 days   Alveria Mcglaughlin II,Kacia Halley E 12/22/2012, 6:18 AM

## 2012-12-22 NOTE — Discharge Summary (Signed)
Obstetric Discharge Summary Reason for Admission: onset of labor Prenatal Procedures: ultrasound Intrapartum Procedures: spontaneous vaginal delivery Postpartum Procedures: none Complications-Operative and Postpartum: none Hemoglobin  Date Value Range Status  12/21/2012 11.5* 12.0 - 15.0 g/dL Final     HCT  Date Value Range Status  12/21/2012 34.2* 36.0 - 46.0 % Final    Physical Exam:  General: alert, cooperative and no distress Lochia: appropriate Uterine Fundus: firm Incision: healing well DVT Evaluation: No evidence of DVT seen on physical exam.  Discharge Diagnoses: Term Pregnancy-delivered  Discharge Information: Date: 12/22/2012 Activity: pelvic rest Diet: routine Medications: PNV, Ibuprofen and Percocet Condition: stable Instructions: refer to practice specific booklet Discharge to: home   Newborn Data: Live born female  Birth Weight: 7 lb 12.7 oz (3535 g) APGAR: 7, 9  Home with mother.  Amin Fornwalt II,Cadey Bazile E 12/22/2012, 6:20 AM

## 2012-12-26 ENCOUNTER — Ambulatory Visit (HOSPITAL_COMMUNITY)
Admit: 2012-12-26 | Discharge: 2012-12-26 | Disposition: A | Discharge status: Home / Self Care | Payer: 59 | Attending: Obstetrics and Gynecology | Admitting: Obstetrics and Gynecology

## 2012-12-26 NOTE — Lactation Note (Signed)
Adult Lactation Consultation Outpatient Visit Note  Patient Name: Jody [name](Mom)     BABY: [name] Date of Birth: [birth year]                         DOB: [birth year] Gestational Age at Delivery: [redacted]w[redacted]d          BIRTH WEIGHT: 7-12.7 Type of Delivery: NVD                               DISCHARGE WEIGHT: 7-6.9                                                                    WEIGHT TODAY: 7-9.6 Breastfeeding History: Frequency of Breastfeeding: NONE Length of Feeding:  Voids: QS Stools: QS  Supplementing / Method:ENFAMIL 60 MLS EVERY 3 HOURS PER BOTTLE Pumping:  Type of Pump:SYMPHONY   Frequency:NONE IN 2 DAYS  Volume:    Comments:    Consultation Evaluation:  MOM HAS HX OF A BREAST REDUCTION 17 YEARS AGO.  BABY IS 88 DAYS OLD AND GAINING WEIGHT WELL FROM SUPPLEMENTATION.  BABY WAS LATCHING IN THE HOSPITAL AND MOM WAS POST PUMPING BUT NEVER OBTAINED ANY COLOSTRUM.  MOM STATES SHE STOPPED PUTTING BABY TO THE BREAST AND PUMPING 2 DAYS AGO WHEN BABY HAD LOST WEIGHT.  MOM STATES BREASTS STARTED FEELING FULL YESTERDAY AND SHE NOTICED LEAKING.  PATIENT DESIRES TO BEGIN PUMPING EVERY 3 HOURS AND RETURN IN 1 WEEK FOR LATCH ASSIST.  PATIENT PUMPED IN OFFICE AND OBTAINED 13 MLS TOTAL AND WAS ENCOURAGED.  Initial Feeding Assessment: Pre-feed Weight: Post-feed Weight: Amount Transferred: Comments:  Additional Feeding Assessment: Pre-feed Weight: Post-feed Weight: Amount Transferred: Comments:  Additional Feeding Assessment: Pre-feed Weight: Post-feed Weight: Amount Transferred: Comments:  Total Breast milk Transferred this Visit:  Total Supplement Given:   Additional Interventions:   Follow-Up  Friday 01/03/13 1030      Hansel Feinstein 12/26/2012, 2:01 PM

## 2012-12-30 ENCOUNTER — Telehealth (HOSPITAL_COMMUNITY): Payer: Self-pay | Admitting: *Deleted

## 2012-12-30 NOTE — Telephone Encounter (Signed)
Resolve episode 

## 2013-01-03 ENCOUNTER — Ambulatory Visit (HOSPITAL_COMMUNITY): Payer: 59

## 2013-01-20 ENCOUNTER — Encounter (HOSPITAL_COMMUNITY)
Admission: RE | Admit: 2013-01-20 | Discharge: 2013-01-20 | Disposition: A | Discharge status: Home / Self Care | Payer: 59 | Source: Ambulatory Visit | Attending: Obstetrics and Gynecology | Admitting: Obstetrics and Gynecology

## 2013-01-20 DIAGNOSIS — O923 Agalactia: Secondary | ICD-10-CM | POA: Insufficient documentation

## 2013-03-14 ENCOUNTER — Encounter: Payer: Self-pay | Admitting: Family Medicine

## 2013-03-14 ENCOUNTER — Ambulatory Visit (INDEPENDENT_AMBULATORY_CARE_PROVIDER_SITE_OTHER): Payer: 59 | Admitting: Family Medicine

## 2013-03-14 VITALS — BP 110/60 | HR 93 | Temp 98.5°F | Ht 63.75 in | Wt 193.6 lb

## 2013-03-14 DIAGNOSIS — G43909 Migraine, unspecified, not intractable, without status migrainosus: Secondary | ICD-10-CM

## 2013-03-14 DIAGNOSIS — K219 Gastro-esophageal reflux disease without esophagitis: Secondary | ICD-10-CM

## 2013-03-14 MED ORDER — PANTOPRAZOLE SODIUM 40 MG PO TBEC: 40.0000 mg | DELAYED_RELEASE_TABLET | Freq: Every day | ORAL | Status: DC

## 2013-03-14 MED ORDER — ONDANSETRON 4 MG PO TBDP: 4.0000 mg | ORAL_TABLET | Freq: Three times a day (TID) | ORAL | Status: DC | PRN

## 2013-03-14 MED ORDER — SUMATRIPTAN SUCCINATE 100 MG PO TABS: 100.0000 mg | ORAL_TABLET | ORAL | Status: DC | PRN

## 2013-03-14 NOTE — Progress Notes (Signed)
  Subjective:    Patient ID: Jody [name], female    DOB: Aug 12, 1976, 37 y.o.   MRN: [id number]  HPI HA- pt reports she has been having HAs for last month and has been having menstrual bleeding since 3/31.  Currently on OCPs.  Hx of HAs but not this severe.  Has vomited on multiple occasions due to pain.  Has eye exam upcoming.  HAs typically start in back of neck and radiates forward to behind eyes.  Has seen chiropractic x2.  Some relief w/ 600mg  ibuprofen.  + photo/phonophobia.  GERD- chronic problem, needs to switch from Nexium to Protonix due to formulary change.     Review of Systems For ROS see HPI     Objective:   Physical Exam  Vitals reviewed. Constitutional: She is oriented to person, place, and time. She appears well-developed and well-nourished. No distress.  HENT:  Head: Normocephalic and atraumatic.  TMs WNL No TTP over sinuses Minimal nasal congestion  Eyes: Conjunctivae and EOM are normal. Pupils are equal, round, and reactive to light.  Neck: Normal range of motion. Neck supple.  Cardiovascular: Normal rate, regular rhythm, normal heart sounds and intact distal pulses.   Pulmonary/Chest: Effort normal and breath sounds normal. No respiratory distress. She has no wheezes. She has no rales.  Lymphadenopathy:    She has no cervical adenopathy.  Neurological: She is alert and oriented to person, place, and time. She has normal reflexes. No cranial nerve deficit. Coordination normal.  Psychiatric: She has a normal mood and affect. Her behavior is normal. Judgment and thought content normal.          Assessment & Plan:

## 2013-03-14 NOTE — Patient Instructions (Addendum)
At the first sign of a headache take 2 excedrin migraine If no improvement in 20-30 minutes, take the Imitrex Drink plenty of fluids- dehydration worsens headaches Zofran as needed for nausea Call with any questions or concerns Hang in there! Congrats! Happy Mother's Day!!!

## 2013-03-16 NOTE — Assessment & Plan Note (Signed)
Switch to protonix due to formulary change

## 2013-03-16 NOTE — Assessment & Plan Note (Signed)
Deteriorated.  Suspect this is due to hormonal changes since giving birth and starting OCPs.  Start excedrin migraine at first sign of HA.  If HA persists or progresses, add Imitrex.  zofran prn.  Reviewed supportive care and red flags that should prompt return.  Pt expressed understanding and is in agreement w/ plan.

## 2013-06-27 ENCOUNTER — Ambulatory Visit (INDEPENDENT_AMBULATORY_CARE_PROVIDER_SITE_OTHER): Payer: 59 | Admitting: Family Medicine

## 2013-06-27 ENCOUNTER — Telehealth: Payer: Self-pay

## 2013-06-27 ENCOUNTER — Encounter: Payer: Self-pay | Admitting: Family Medicine

## 2013-06-27 VITALS — BP 130/86 | HR 85 | Temp 98.4°F | Ht 64.5 in | Wt 201.4 lb

## 2013-06-27 DIAGNOSIS — R0789 Other chest pain: Secondary | ICD-10-CM

## 2013-06-27 DIAGNOSIS — R0602 Shortness of breath: Secondary | ICD-10-CM | POA: Insufficient documentation

## 2013-06-27 DIAGNOSIS — F329 Major depressive disorder, single episode, unspecified: Secondary | ICD-10-CM

## 2013-06-27 LAB — CBC WITH DIFFERENTIAL/PLATELET
Basophils Absolute: 0 10*3/uL (ref 0.0–0.1)
Basophils Relative: 0.4 % (ref 0.0–3.0)
Eosinophils Absolute: 0.1 10*3/uL (ref 0.0–0.7)
Lymphocytes Relative: 35.1 % (ref 12.0–46.0)
MCHC: 34.2 g/dL (ref 30.0–36.0)
Monocytes Absolute: 0.6 10*3/uL (ref 0.1–1.0)
Neutrophils Relative %: 57.3 % (ref 43.0–77.0)
Platelets: 292 10*3/uL (ref 150.0–400.0)
RBC: 4.74 Mil/uL (ref 3.87–5.11)

## 2013-06-27 LAB — BASIC METABOLIC PANEL
Chloride: 103 mEq/L (ref 96–112)
GFR: 102.52 mL/min (ref >60.00)
Potassium: 3.8 mEq/L (ref 3.5–5.1)
Sodium: 137 mEq/L (ref 135–145)

## 2013-06-27 LAB — HEPATIC FUNCTION PANEL
AST: 23 U/L (ref 0–37)
Alkaline Phosphatase: 52 U/L (ref 39–117)
Bilirubin, Direct: 0.1 mg/dL (ref 0.0–0.3)
Total Bilirubin: 0.6 mg/dL (ref 0.3–1.2)

## 2013-06-27 LAB — TSH: TSH: 1.79 u[IU]/mL (ref 0.35–5.50)

## 2013-06-27 MED ORDER — FLUOXETINE HCL 20 MG PO TABS: 20.0000 mg | ORAL_TABLET | Freq: Every day | ORAL | Status: DC

## 2013-06-27 NOTE — Progress Notes (Signed)
  Subjective:    Patient ID: Jody [name], female    DOB: 1976/05/18, 37 y.o.   MRN: [id number]  HPI Fatigue- 'i feel like i can't breathe very well'.  Some associated CP- described as a 'heaviness'.  Increased stress.  No nausea.  No diaphoresis.  Some associated light headedness.  Took Upreg yesterday and it was negative (LMP 8/3).  Sensation of SOB is frequent but the episodic chest pain and light headedness has occurred 2-3x in 2 weeks.  No hx of similar.  Increased thirst.  Increased hunger.  No increased urination.  Anxiety/depression- previously on Lexapro but 'i was a zombie'.  Doesn't want anything that will make her gain weight.  But does want to restart something.   Review of Systems For ROS see HPI     Objective:   Physical Exam  Vitals reviewed. Constitutional: She is oriented to person, place, and time. She appears well-developed and well-nourished. No distress.  HENT:  Head: Normocephalic and atraumatic.  Eyes: Conjunctivae and EOM are normal. Pupils are equal, round, and reactive to light.  Neck: Normal range of motion. Neck supple. No thyromegaly present.  Cardiovascular: Normal rate, regular rhythm, normal heart sounds and intact distal pulses.   No murmur heard. Pulmonary/Chest: Effort normal and breath sounds normal. No respiratory distress.  Abdominal: Soft. She exhibits no distension. There is no tenderness.  Musculoskeletal: She exhibits no edema.  Lymphadenopathy:    She has no cervical adenopathy.  Neurological: She is alert and oriented to person, place, and time.  Skin: Skin is warm and dry.  Psychiatric: She has a normal mood and affect. Her behavior is normal.          Assessment & Plan:

## 2013-06-27 NOTE — Telephone Encounter (Signed)
Patient called the office to schedule an appt and was put in today. Due to symptoms patient was given a return call to triage. Patient states that she has no SOB or chest pain it's just tight. Has started with a cough and stuffy nose today. Patient advised to let us know if symptoms change or worsen or go to the ED. Patient agrees with plan.

## 2013-06-27 NOTE — Assessment & Plan Note (Signed)
New.  EKG w/out obvious cause.  Suspect this is anxiety related but will get labs to r/o diabetes as pt has some concerning sxs- increased hunger and thirst.  Ddimer to r/o PE.  Reviewed supportive care and red flags that should prompt return.  Pt expressed understanding and is in agreement w/ plan.

## 2013-06-27 NOTE — Patient Instructions (Addendum)
We'll notify you of your lab results and make any changes if needed Start the Prozac daily for anxiety If your symptoms change or worsen- please call Call with any questions or concerns Hang in there!!!

## 2013-06-27 NOTE — Assessment & Plan Note (Signed)
New.  No obvious cause.  No evidence of DVT but will get D dimer to r/o possible PE.  Suspect this is due to pt's anxiety.  Will follow closely.

## 2013-06-27 NOTE — Assessment & Plan Note (Signed)
Deteriorated.  Restart meds- Prozac to avoid weight gain.  Will follow closely.

## 2013-06-30 ENCOUNTER — Encounter: Payer: Self-pay | Admitting: *Deleted

## 2013-07-08 ENCOUNTER — Encounter: Payer: Self-pay | Admitting: Family Medicine

## 2013-07-08 ENCOUNTER — Ambulatory Visit (INDEPENDENT_AMBULATORY_CARE_PROVIDER_SITE_OTHER): Payer: 59 | Admitting: Family Medicine

## 2013-07-08 VITALS — BP 118/80 | HR 84 | Temp 99.2°F | Wt 200.0 lb

## 2013-07-08 DIAGNOSIS — R05 Cough: Secondary | ICD-10-CM

## 2013-07-08 DIAGNOSIS — R059 Cough, unspecified: Secondary | ICD-10-CM

## 2013-07-08 DIAGNOSIS — J019 Acute sinusitis, unspecified: Secondary | ICD-10-CM

## 2013-07-08 MED ORDER — CEFUROXIME AXETIL 500 MG PO TABS: 500.0000 mg | ORAL_TABLET | Freq: Two times a day (BID) | ORAL | Status: AC

## 2013-07-08 MED ORDER — BECLOMETHASONE DIPROPIONATE 80 MCG/ACT NA AERS: 2.0000 | INHALATION_SPRAY | Freq: Every day | NASAL | Status: DC

## 2013-07-08 MED ORDER — GUAIFENESIN-CODEINE 100-10 MG/5ML PO SYRP: 5.0000 mL | ORAL_SOLUTION | Freq: Three times a day (TID) | ORAL | Status: DC | PRN

## 2013-07-08 NOTE — Patient Instructions (Signed)

## 2013-07-08 NOTE — Progress Notes (Signed)
  Subjective:     Jody [name] is a 37 y.o. female who presents for evaluation of sinus pain. Symptoms include: congestion, facial pain, headaches, nasal congestion and sinus pressure. Onset of symptoms was 1 week ago. Symptoms have been gradually worsening since that time. Past history is significant for no history of pneumonia or bronchitis. Patient is a non-smoker.  The following portions of the patient's history were reviewed and updated as appropriate: allergies, current medications, past family history, past medical history, past social history, past surgical history and problem list.  Review of Systems Pertinent items are noted in HPI.   Objective:    BP 118/80  Pulse 84  Temp(Src) 99.2 F (37.3 C) (Oral)  Wt 200 lb (90.719 kg)  BMI 33.81 kg/m2  SpO2 97%  LMP 05/11/2013 General appearance: alert, cooperative, appears stated age and no distress Ears: normal TM's and external ear canals both ears Nose: green discharge, moderate congestion, turbinates red, swollen, sinus tenderness bilateral Throat: lips, mucosa, and tongue normal; teeth and gums normal Neck: mild anterior cervical adenopathy, supple, symmetrical, trachea midline and thyroid not enlarged, symmetric, no tenderness/mass/nodules Lungs: clear to auscultation bilaterally Heart: S1, S2 normal    Assessment:    Acute bacterial sinusitis.    Plan:    Nasal steroids per medication orders. Antihistamines per medication orders. Ceftin per medication orders.

## 2013-08-25 ENCOUNTER — Encounter: Payer: Self-pay | Admitting: Family Medicine

## 2013-08-25 ENCOUNTER — Ambulatory Visit (INDEPENDENT_AMBULATORY_CARE_PROVIDER_SITE_OTHER): Payer: 59 | Admitting: Family Medicine

## 2013-08-25 VITALS — BP 116/76 | HR 87 | Temp 99.1°F | Wt 203.0 lb

## 2013-08-25 DIAGNOSIS — R05 Cough: Secondary | ICD-10-CM

## 2013-08-25 DIAGNOSIS — J019 Acute sinusitis, unspecified: Secondary | ICD-10-CM

## 2013-08-25 MED ORDER — CLARITHROMYCIN ER 500 MG PO TB24: 1000.0000 mg | ORAL_TABLET | Freq: Every day | ORAL | Status: DC

## 2013-08-25 MED ORDER — GUAIFENESIN-CODEINE 100-10 MG/5ML PO SYRP: 5.0000 mL | ORAL_SOLUTION | Freq: Three times a day (TID) | ORAL | Status: DC | PRN

## 2013-08-25 NOTE — Progress Notes (Signed)
  Subjective:     Jody [name] is a 37 y.o. female who presents for evaluation of sinus pain. Symptoms include: congestion, facial pain, headaches, nasal congestion, purulent rhinorrhea and sinus pressure. Onset of symptoms was 2 weeks ago. Symptoms have been gradually worsening since that time. Past history is significant for no history of pneumonia or bronchitis. Patient is a non-smoker.  The following portions of the patient's history were reviewed and updated as appropriate: allergies, current medications, past family history, past medical history, past social history, past surgical history and problem list.  Review of Systems Pertinent items are noted in HPI.   Objective:    BP 116/76  Pulse 87  Temp(Src) 99.1 F (37.3 C) (Oral)  Wt 203 lb (92.08 kg)  BMI 34.32 kg/m2  SpO2 98% General appearance: alert, cooperative, appears stated age and no distress Ears: normal TM's and external ear canals both ears Nose: green discharge, moderate congestion, turbinates red, sinus tenderness bilateral Throat: abnormal findings: pnd Neck: marked anterior cervical adenopathy, supple, symmetrical, trachea midline and thyroid not enlarged, symmetric, no tenderness/mass/nodules Lungs: clear to auscultation bilaterally Heart: S1, S2 normal Skin: Skin color, texture, turgor normal. No rashes or lesions    Assessment:    Acute bacterial sinusitis.    Plan:    Nasal steroids per medication orders. Antihistamines per medication orders. Biaxin per medication orders.

## 2013-08-25 NOTE — Patient Instructions (Signed)

## 2013-09-04 ENCOUNTER — Encounter: Payer: Self-pay | Admitting: Family Medicine

## 2013-09-04 ENCOUNTER — Ambulatory Visit (INDEPENDENT_AMBULATORY_CARE_PROVIDER_SITE_OTHER): Payer: 59 | Admitting: Family Medicine

## 2013-09-04 VITALS — BP 132/86 | HR 100 | Temp 98.8°F | Resp 16 | Wt 204.1 lb

## 2013-09-04 DIAGNOSIS — E669 Obesity, unspecified: Secondary | ICD-10-CM

## 2013-09-04 DIAGNOSIS — M5430 Sciatica, unspecified side: Secondary | ICD-10-CM

## 2013-09-04 DIAGNOSIS — M5431 Sciatica, right side: Secondary | ICD-10-CM

## 2013-09-04 DIAGNOSIS — E66811 Obesity, class 1: Secondary | ICD-10-CM

## 2013-09-04 MED ORDER — PREDNISONE 10 MG PO TABS: ORAL_TABLET | ORAL | Status: DC

## 2013-09-04 MED ORDER — CYCLOBENZAPRINE HCL 10 MG PO TABS: 10.0000 mg | ORAL_TABLET | Freq: Three times a day (TID) | ORAL | Status: DC | PRN

## 2013-09-04 MED ORDER — ALPRAZOLAM 0.5 MG PO TABS: 0.5000 mg | ORAL_TABLET | Freq: Two times a day (BID) | ORAL | Status: DC

## 2013-09-04 NOTE — Progress Notes (Signed)
  Subjective:    Patient ID: Jody [name], female    DOB: 05/26/1976, 37 y.o.   MRN: [id number]  HPI Back pain- has hx of sciatica and usually sees chiropracter every 4-5 weeks and has been twice in 2 weeks.  Pain is radiating down R leg.  Leg feels weak and unable to bear weight w/out feeling as if she's going to fall.  Pt is carrying her son on R hip.  sxs first started ~2 weeks ago.  Last night was unable to get comfortable.  Took ibuprofen 600mg  last night w/ minimal improvement.  Obesity- pt very upset about her weight gain.  Admits that she is not exercising.  Tends to eat breakfast, skip lunch, binge at night.  Weight is worsening her depression and feels this is contributing to her back pain.   Review of Systems For ROS see HPI     Objective:   Physical Exam  Vitals reviewed. Constitutional: She is oriented to person, place, and time. She appears well-developed and well-nourished. No distress.  overweight  Cardiovascular: Intact distal pulses.   Musculoskeletal:  (-) SLR bilaterally Good flexion and extension of back + TTP over R sciatic notch Strength 5/5 and symmetric of both legs  Neurological: She is alert and oriented to person, place, and time. She has normal reflexes. Coordination normal.          Assessment & Plan:

## 2013-09-04 NOTE — Patient Instructions (Signed)
Follow up in 3 months to recheck weight loss Schedule your activity so you don't blow it off Use MyFitnessPal App to enter food, track calories, and exercise It's better to graze all day than eat a huge meal after starving- plan ahead for healthy choices Start the Prednisone as directed- take w/ food Use the flexeril at night HEAT! Call with any questions or concerns Happy Early Birthday!!!

## 2013-09-06 DIAGNOSIS — E669 Obesity, unspecified: Secondary | ICD-10-CM | POA: Insufficient documentation

## 2013-09-06 NOTE — Assessment & Plan Note (Signed)
New to provider, ongoing for pt.  Pt now frustrated w/ her weight but admits to poor eating and very limited activity.  Discussed small modifications to make to diet and activity.  Pt to download app to track calories consumed vs burned.  Advised to eat throughout the day rather than binging at night.  Will follow closely.

## 2013-09-06 NOTE — Assessment & Plan Note (Signed)
New.  Start pred taper for pain relief.  Muscle relaxer prn.  Heat.  Reviewed supportive care and red flags that should prompt return.  Pt expressed understanding and is in agreement w/ plan.

## 2013-10-10 LAB — OB RESULTS CONSOLE RUBELLA ANTIBODY, IGM: Rubella: IMMUNE

## 2013-10-10 LAB — OB RESULTS CONSOLE ANTIBODY SCREEN: Antibody Screen: NEGATIVE

## 2013-10-10 LAB — OB RESULTS CONSOLE GC/CHLAMYDIA
Chlamydia: NEGATIVE
GC PROBE AMP, GENITAL: NEGATIVE

## 2013-10-10 LAB — OB RESULTS CONSOLE HEPATITIS B SURFACE ANTIGEN: HEP B S AG: NEGATIVE

## 2013-10-10 LAB — OB RESULTS CONSOLE HIV ANTIBODY (ROUTINE TESTING): HIV: NONREACTIVE

## 2013-10-10 LAB — OB RESULTS CONSOLE RPR: RPR: NONREACTIVE

## 2013-10-10 LAB — OB RESULTS CONSOLE ABO/RH: RH TYPE: POSITIVE

## 2013-11-06 NOTE — L&D Delivery Note (Signed)
Delivery Note At 4:40 PM a viable female was delivered via Vaginal, Spontaneous Delivery (Presentation:VTX LOA ;  ).  APGAR: ,4/ 9; weight 9LBS 9OZ.   Placenta status: Intact, Spontaneous.  Cord: 3 vessels with the following complications: MECONIUM STAINED AMNIOTIC FLUID  MODERATE SHOULDER DYSTOCIA RELIEVED WITH MCROBERTS AND ROTATION OF SHOULDERS TO OBLIQUE POSITION.  GOOD MOVEMENT AND TONE OF UPPER EXTREMITIES .  Cord pH: PENDING  Anesthesia: Epidural  Episiotomy: None Lacerations: 2nd degree;Perineal Suture Repair: 2.0 chromic Est. Blood Loss (mL):   Mom to postpartum.  Baby to Couplet care / Skin to Skin.  Jody [name] S 05/12/2014, 4:54 PM

## 2014-04-26 ENCOUNTER — Encounter (HOSPITAL_COMMUNITY): Payer: Self-pay | Admitting: *Deleted

## 2014-04-26 ENCOUNTER — Inpatient Hospital Stay (HOSPITAL_COMMUNITY)
Admission: AD | Admit: 2014-04-26 | Discharge: 2014-04-26 | Disposition: A | Discharge status: Home / Self Care | Payer: 59 | Source: Ambulatory Visit | Attending: Obstetrics and Gynecology | Admitting: Obstetrics and Gynecology

## 2014-04-26 DIAGNOSIS — O479 False labor, unspecified: Secondary | ICD-10-CM | POA: Insufficient documentation

## 2014-04-26 NOTE — MAU Note (Signed)
Pt presents to MAU with complaints of contractions that started yesterday. Denies any vaginal bleeding or LOF

## 2014-04-26 NOTE — Discharge Instructions (Signed)
Braxton Hicks Contractions °Contractions of the uterus can occur throughout pregnancy. Contractions are not always a sign that you are in labor.  °WHAT ARE BRAXTON HICKS CONTRACTIONS?  °Contractions that occur before labor are called Braxton Hicks contractions, or false labor. Toward the end of pregnancy (32-34 weeks), these contractions can develop more often and may become more forceful. This is not true labor because these contractions do not result in opening (dilatation) and thinning of the cervix. They are sometimes difficult to tell apart from true labor because these contractions can be forceful and people have different pain tolerances. You should not feel embarrassed if you go to the hospital with false labor. Sometimes, the only way to tell if you are in true labor is for your health care provider to look for changes in the cervix. °If there are no prenatal problems or other health problems associated with the pregnancy, it is completely safe to be sent home with false labor and await the onset of true labor. °HOW CAN YOU TELL THE DIFFERENCE BETWEEN TRUE AND FALSE LABOR? °False Labor °· The contractions of false labor are usually shorter and not as hard as those of true labor.   °· The contractions are usually irregular.   °· The contractions are often felt in the front of the lower abdomen and in the groin.   °· The contractions may go away when you walk around or change positions while lying down.   °· The contractions get weaker and are shorter lasting as time goes on.   °· The contractions do not usually become progressively stronger, regular, and closer together as with true labor.   °True Labor °· Contractions in true labor last 30-70 seconds, become very regular, usually become more intense, and increase in frequency.   °· The contractions do not go away with walking.   °· The discomfort is usually felt in the top of the uterus and spreads to the lower abdomen and low back.   °· True labor can be  determined by your health care provider with an exam. This will show that the cervix is dilating and getting thinner.   °WHAT TO REMEMBER °· Keep up with your usual exercises and follow other instructions given by your health care provider.   °· Take medicines as directed by your health care provider.   °· Keep your regular prenatal appointments.   °· Eat and drink lightly if you think you are going into labor.   °· If Braxton Hicks contractions are making you uncomfortable:   °¨ Change your position from lying down or resting to walking, or from walking to resting.   °¨ Sit and rest in a tub of warm water.   °¨ Drink 2-3 glasses of water. Dehydration may cause these contractions.   °¨ Do slow and deep breathing several times an hour.   °WHEN SHOULD I SEEK IMMEDIATE MEDICAL CARE? °Seek immediate medical care if: °· Your contractions become stronger, more regular, and closer together.   °· You have fluid leaking or gushing from your vagina.   °· You have a fever.   °· You pass blood-tinged mucus.   °· You have vaginal bleeding.   °· You have continuous abdominal pain.   °· You have low back pain that you never had before.   °· You feel your baby's head pushing down and causing pelvic pressure.   °· Your baby is not moving as much as it used to.   °Document Released: 10/23/2005 Document Revised: 10/28/2013 Document Reviewed: 08/04/2013 °[employer]® Patient Information ©2015 [employer]. This information is not intended to replace advice given to you by your health care   provider. Make sure you discuss any questions you have with your health care provider. ° °

## 2014-04-29 ENCOUNTER — Telehealth (HOSPITAL_COMMUNITY): Payer: Self-pay | Admitting: *Deleted

## 2014-04-29 ENCOUNTER — Encounter (HOSPITAL_COMMUNITY): Payer: Self-pay | Admitting: *Deleted

## 2014-04-29 LAB — OB RESULTS CONSOLE GBS: STREP GROUP B AG: NEGATIVE

## 2014-04-29 NOTE — Telephone Encounter (Signed)
Preadmission screen  

## 2014-05-01 ENCOUNTER — Telehealth (HOSPITAL_COMMUNITY): Payer: Self-pay | Admitting: *Deleted

## 2014-05-07 ENCOUNTER — Inpatient Hospital Stay (HOSPITAL_COMMUNITY): Admission: RE | Admit: 2014-05-07 | Payer: 59 | Source: Ambulatory Visit

## 2014-05-08 NOTE — Telephone Encounter (Signed)
Preadmission screen  

## 2014-05-12 ENCOUNTER — Encounter (HOSPITAL_COMMUNITY): Payer: 59 | Admitting: Anesthesiology

## 2014-05-12 ENCOUNTER — Inpatient Hospital Stay (HOSPITAL_COMMUNITY): Payer: 59 | Admitting: Anesthesiology

## 2014-05-12 ENCOUNTER — Encounter (HOSPITAL_COMMUNITY): Payer: Self-pay

## 2014-05-12 ENCOUNTER — Inpatient Hospital Stay (HOSPITAL_COMMUNITY)
Admission: RE | Admit: 2014-05-12 | Discharge: 2014-05-14 | DRG: 774 | Disposition: A | Discharge status: Home / Self Care | Payer: 59 | Source: Ambulatory Visit | Attending: Obstetrics and Gynecology | Admitting: Obstetrics and Gynecology

## 2014-05-12 VITALS — BP 107/67 | HR 88 | Temp 98.4°F | Resp 20 | Ht 64.0 in | Wt 220.0 lb

## 2014-05-12 DIAGNOSIS — F341 Dysthymic disorder: Secondary | ICD-10-CM | POA: Diagnosis present

## 2014-05-12 DIAGNOSIS — O99344 Other mental disorders complicating childbirth: Secondary | ICD-10-CM | POA: Diagnosis present

## 2014-05-12 DIAGNOSIS — F3289 Other specified depressive episodes: Secondary | ICD-10-CM

## 2014-05-12 DIAGNOSIS — O2432 Unspecified pre-existing diabetes mellitus in childbirth: Secondary | ICD-10-CM | POA: Diagnosis present

## 2014-05-12 DIAGNOSIS — J189 Pneumonia, unspecified organism: Secondary | ICD-10-CM

## 2014-05-12 DIAGNOSIS — O368990 Maternal care for other specified fetal problems, unspecified trimester, not applicable or unspecified: Secondary | ICD-10-CM | POA: Diagnosis present

## 2014-05-12 DIAGNOSIS — N926 Irregular menstruation, unspecified: Secondary | ICD-10-CM

## 2014-05-12 DIAGNOSIS — K219 Gastro-esophageal reflux disease without esophagitis: Secondary | ICD-10-CM | POA: Diagnosis present

## 2014-05-12 DIAGNOSIS — E119 Type 2 diabetes mellitus without complications: Secondary | ICD-10-CM | POA: Diagnosis present

## 2014-05-12 DIAGNOSIS — O99214 Obesity complicating childbirth: Secondary | ICD-10-CM

## 2014-05-12 DIAGNOSIS — IMO0002 Reserved for concepts with insufficient information to code with codable children: Secondary | ICD-10-CM | POA: Diagnosis not present

## 2014-05-12 DIAGNOSIS — Z833 Family history of diabetes mellitus: Secondary | ICD-10-CM

## 2014-05-12 DIAGNOSIS — O43899 Other placental disorders, unspecified trimester: Secondary | ICD-10-CM

## 2014-05-12 DIAGNOSIS — Z3490 Encounter for supervision of normal pregnancy, unspecified, unspecified trimester: Secondary | ICD-10-CM

## 2014-05-12 DIAGNOSIS — R7309 Other abnormal glucose: Secondary | ICD-10-CM

## 2014-05-12 DIAGNOSIS — Z8249 Family history of ischemic heart disease and other diseases of the circulatory system: Secondary | ICD-10-CM

## 2014-05-12 DIAGNOSIS — R0602 Shortness of breath: Secondary | ICD-10-CM

## 2014-05-12 DIAGNOSIS — O09529 Supervision of elderly multigravida, unspecified trimester: Secondary | ICD-10-CM | POA: Diagnosis present

## 2014-05-12 DIAGNOSIS — R0789 Other chest pain: Secondary | ICD-10-CM

## 2014-05-12 DIAGNOSIS — E66811 Obesity, class 1: Secondary | ICD-10-CM

## 2014-05-12 DIAGNOSIS — M5431 Sciatica, right side: Secondary | ICD-10-CM

## 2014-05-12 DIAGNOSIS — E669 Obesity, unspecified: Secondary | ICD-10-CM | POA: Diagnosis present

## 2014-05-12 DIAGNOSIS — Z6837 Body mass index (BMI) 37.0-37.9, adult: Secondary | ICD-10-CM

## 2014-05-12 DIAGNOSIS — E7800 Pure hypercholesterolemia, unspecified: Secondary | ICD-10-CM

## 2014-05-12 DIAGNOSIS — F329 Major depressive disorder, single episode, unspecified: Secondary | ICD-10-CM

## 2014-05-12 DIAGNOSIS — K5900 Constipation, unspecified: Secondary | ICD-10-CM

## 2014-05-12 LAB — ABO/RH: ABO/RH(D): A POS

## 2014-05-12 LAB — TYPE AND SCREEN
ABO/RH(D): A POS
ANTIBODY SCREEN: NEGATIVE

## 2014-05-12 LAB — SAMPLE TO BLOOD BANK

## 2014-05-12 LAB — CBC
HCT: 36.3 % (ref 36.0–46.0)
HEMOGLOBIN: 12.3 g/dL (ref 12.0–15.0)
MCH: 28.9 pg (ref 26.0–34.0)
MCHC: 33.9 g/dL (ref 30.0–36.0)
MCV: 85.4 fL (ref 78.0–100.0)
Platelets: 286 10*3/uL (ref 150–400)
RBC: 4.25 MIL/uL (ref 3.87–5.11)
RDW: 13.8 % (ref 11.5–15.5)
WBC: 10.2 10*3/uL (ref 4.0–10.5)

## 2014-05-12 LAB — RPR

## 2014-05-12 MED ORDER — OXYTOCIN BOLUS FROM INFUSION
500.0000 mL | INTRAVENOUS | Status: DC
  Administered 2014-05-12: 500 mL via INTRAVENOUS

## 2014-05-12 MED ORDER — ONDANSETRON HCL 4 MG PO TABS: 4.0000 mg | ORAL_TABLET | ORAL | Status: DC | PRN

## 2014-05-12 MED ORDER — OXYTOCIN 40 UNITS IN LACTATED RINGERS INFUSION - SIMPLE MED
1.0000 m[IU]/min | INTRAVENOUS | Status: DC
  Administered 2014-05-12: 2 m[IU]/min via INTRAVENOUS
  Filled 2014-05-12: qty 1000

## 2014-05-12 MED ORDER — DIBUCAINE 1 % RE OINT: 1.0000 "application " | TOPICAL_OINTMENT | RECTAL | Status: DC | PRN

## 2014-05-12 MED ORDER — ONDANSETRON HCL 4 MG/2ML IJ SOLN: 4.0000 mg | Freq: Four times a day (QID) | INTRAMUSCULAR | Status: DC | PRN

## 2014-05-12 MED ORDER — BISACODYL 10 MG RE SUPP: 10.0000 mg | Freq: Every day | RECTAL | Status: DC | PRN

## 2014-05-12 MED ORDER — WITCH HAZEL-GLYCERIN EX PADS
1.0000 "application " | MEDICATED_PAD | CUTANEOUS | Status: DC | PRN
  Administered 2014-05-12: 1 via TOPICAL

## 2014-05-12 MED ORDER — IBUPROFEN 600 MG PO TABS
600.0000 mg | ORAL_TABLET | Freq: Four times a day (QID) | ORAL | Status: DC
Start: 2014-05-12 — End: 2014-05-14
  Administered 2014-05-12 – 2014-05-14 (×7): 600 mg via ORAL
  Filled 2014-05-12 (×8): qty 1

## 2014-05-12 MED ORDER — PRENATAL MULTIVITAMIN CH
1.0000 | ORAL_TABLET | Freq: Every day | ORAL | Status: DC
  Administered 2014-05-13: 1 via ORAL
  Filled 2014-05-12 (×2): qty 1

## 2014-05-12 MED ORDER — ONDANSETRON HCL 4 MG/2ML IJ SOLN: 4.0000 mg | INTRAMUSCULAR | Status: DC | PRN

## 2014-05-12 MED ORDER — OXYCODONE-ACETAMINOPHEN 5-325 MG PO TABS
1.0000 | ORAL_TABLET | ORAL | Status: DC | PRN
  Administered 2014-05-13: 1 via ORAL
  Filled 2014-05-12: qty 1

## 2014-05-12 MED ORDER — ZOLPIDEM TARTRATE 5 MG PO TABS: 5.0000 mg | ORAL_TABLET | Freq: Every evening | ORAL | Status: DC | PRN

## 2014-05-12 MED ORDER — ACETAMINOPHEN 325 MG PO TABS: 650.0000 mg | ORAL_TABLET | ORAL | Status: DC | PRN

## 2014-05-12 MED ORDER — CITRIC ACID-SODIUM CITRATE 334-500 MG/5ML PO SOLN: 30.0000 mL | ORAL | Status: DC | PRN

## 2014-05-12 MED ORDER — EPHEDRINE 5 MG/ML INJ
10.0000 mg | INTRAVENOUS | Status: DC | PRN
  Filled 2014-05-12: qty 2

## 2014-05-12 MED ORDER — LACTATED RINGERS IV SOLN: 500.0000 mL | Freq: Once | INTRAVENOUS | Status: DC

## 2014-05-12 MED ORDER — PHENYLEPHRINE 40 MCG/ML (10ML) SYRINGE FOR IV PUSH (FOR BLOOD PRESSURE SUPPORT)
80.0000 ug | PREFILLED_SYRINGE | INTRAVENOUS | Status: DC | PRN
  Administered 2014-05-12: 80 ug via INTRAVENOUS
  Administered 2014-05-12: 40 ug via INTRAVENOUS
  Filled 2014-05-12: qty 2

## 2014-05-12 MED ORDER — DIPHENHYDRAMINE HCL 50 MG/ML IJ SOLN: 12.5000 mg | INTRAMUSCULAR | Status: DC | PRN

## 2014-05-12 MED ORDER — DIPHENHYDRAMINE HCL 25 MG PO CAPS: 25.0000 mg | ORAL_CAPSULE | Freq: Four times a day (QID) | ORAL | Status: DC | PRN

## 2014-05-12 MED ORDER — HYDROCORTISONE ACE-PRAMOXINE 1-1 % RE FOAM
1.0000 | Freq: Three times a day (TID) | RECTAL | Status: DC
  Administered 2014-05-12 – 2014-05-14 (×5): 1 via RECTAL
  Filled 2014-05-12: qty 10

## 2014-05-12 MED ORDER — FENTANYL 2.5 MCG/ML BUPIVACAINE 1/10 % EPIDURAL INFUSION (WH - ANES)
INTRAMUSCULAR | Status: DC | PRN
  Administered 2014-05-12: 14 mL/h via EPIDURAL

## 2014-05-12 MED ORDER — FENTANYL 2.5 MCG/ML BUPIVACAINE 1/10 % EPIDURAL INFUSION (WH - ANES)
14.0000 mL/h | INTRAMUSCULAR | Status: DC | PRN
  Filled 2014-05-12: qty 125

## 2014-05-12 MED ORDER — TERBUTALINE SULFATE 1 MG/ML IJ SOLN: 0.2500 mg | Freq: Once | INTRAMUSCULAR | Status: DC | PRN

## 2014-05-12 MED ORDER — SIMETHICONE 80 MG PO CHEW: 80.0000 mg | CHEWABLE_TABLET | ORAL | Status: DC | PRN

## 2014-05-12 MED ORDER — FLEET ENEMA 7-19 GM/118ML RE ENEM: 1.0000 | ENEMA | RECTAL | Status: DC | PRN

## 2014-05-12 MED ORDER — PHENYLEPHRINE 40 MCG/ML (10ML) SYRINGE FOR IV PUSH (FOR BLOOD PRESSURE SUPPORT)
80.0000 ug | PREFILLED_SYRINGE | INTRAVENOUS | Status: DC | PRN
  Administered 2014-05-12: 80 ug via INTRAVENOUS
  Filled 2014-05-12: qty 2
  Filled 2014-05-12: qty 10

## 2014-05-12 MED ORDER — LIDOCAINE HCL (PF) 1 % IJ SOLN
INTRAMUSCULAR | Status: DC | PRN
  Administered 2014-05-12 (×2): 4 mL

## 2014-05-12 MED ORDER — OXYTOCIN 40 UNITS IN LACTATED RINGERS INFUSION - SIMPLE MED: 62.5000 mL/h | INTRAVENOUS | Status: DC

## 2014-05-12 MED ORDER — LIDOCAINE HCL (PF) 1 % IJ SOLN
30.0000 mL | INTRAMUSCULAR | Status: DC | PRN
  Administered 2014-05-12: 30 mL via SUBCUTANEOUS
  Filled 2014-05-12: qty 30

## 2014-05-12 MED ORDER — LANOLIN HYDROUS EX OINT: TOPICAL_OINTMENT | CUTANEOUS | Status: DC | PRN

## 2014-05-12 MED ORDER — LACTATED RINGERS IV SOLN: 500.0000 mL | INTRAVENOUS | Status: DC | PRN

## 2014-05-12 MED ORDER — FLEET ENEMA 7-19 GM/118ML RE ENEM: 1.0000 | ENEMA | Freq: Every day | RECTAL | Status: DC | PRN

## 2014-05-12 MED ORDER — IBUPROFEN 600 MG PO TABS
600.0000 mg | ORAL_TABLET | Freq: Four times a day (QID) | ORAL | Status: DC | PRN
  Administered 2014-05-14: 600 mg via ORAL

## 2014-05-12 MED ORDER — SENNOSIDES-DOCUSATE SODIUM 8.6-50 MG PO TABS
2.0000 | ORAL_TABLET | ORAL | Status: DC
  Administered 2014-05-13 – 2014-05-14 (×2): 2 via ORAL
  Filled 2014-05-12 (×2): qty 2

## 2014-05-12 MED ORDER — BENZOCAINE-MENTHOL 20-0.5 % EX AERO
1.0000 "application " | INHALATION_SPRAY | CUTANEOUS | Status: DC | PRN
  Administered 2014-05-12 – 2014-05-14 (×2): 1 via TOPICAL
  Filled 2014-05-12 (×2): qty 56

## 2014-05-12 MED ORDER — LACTATED RINGERS IV SOLN
INTRAVENOUS | Status: DC
  Administered 2014-05-12 (×2): via INTRAVENOUS

## 2014-05-12 MED ORDER — HYDROCORTISONE ACE-PRAMOXINE 2.5-1 % RE CREA
1.0000 "application " | TOPICAL_CREAM | Freq: Three times a day (TID) | RECTAL | Status: DC
  Filled 2014-05-12: qty 30

## 2014-05-12 MED ORDER — TETANUS-DIPHTH-ACELL PERTUSSIS 5-2.5-18.5 LF-MCG/0.5 IM SUSP: 0.5000 mL | Freq: Once | INTRAMUSCULAR | Status: DC

## 2014-05-12 NOTE — Anesthesia Procedure Notes (Signed)
Epidural Patient location during procedure: OB Start time: 05/12/2014 12:04 PM  Staffing Anesthesiologist: Delbra Zellars A. Performed by: anesthesiologist   Preanesthetic Checklist Completed: patient identified, site marked, surgical consent, pre-op evaluation, timeout performed, IV checked, risks and benefits discussed and monitors and equipment checked  Epidural Patient position: sitting Prep: site prepped and draped and DuraPrep Patient monitoring: continuous pulse ox and blood pressure Approach: midline Location: L3-L4 Injection technique: LOR air  Needle:  Needle type: Tuohy  Needle gauge: 17 G Needle length: 9 cm and 9 Needle insertion depth: 7 cm Catheter type: closed end flexible Catheter size: 19 Gauge Catheter at skin depth: 12 cm Test dose: negative and Other  Assessment Events: blood not aspirated, injection not painful, no injection resistance, negative IV test and no paresthesia  Additional Notes Patient identified. Risks and benefits discussed including failed block, incomplete  Pain control, post dural puncture headache, nerve damage, paralysis, blood pressure Changes, nausea, vomiting, reactions to medications-both toxic and allergic and post Partum back pain. All questions were answered. Patient expressed understanding and wished to proceed. Sterile technique was used throughout procedure. Epidural site was Dressed with sterile barrier dressing. No paresthesias, signs of intravascular injection Or signs of intrathecal spread were encountered. Difficult due to obesity and patient position. Attempt x 2 Patient was more comfortable after the epidural was dosed. Please see RN's note for documentation of vital signs and FHR which are stable.

## 2014-05-12 NOTE — H&P (Signed)
Jody [name] is a 38 y.o. female presenting at 4540.1 for induction.  AMA negative genetic work up.  Negative GBS Maternal Medical History:  Reason for admission: Induction   Contractions: Frequency: irregular.   Perceived severity is moderate.    Fetal activity: Perceived fetal activity is normal.    Prenatal complications: no prenatal complications Prenatal Complications - Diabetes: none.    OB History   Grav Para Term Preterm Abortions TAB SAB Ect Mult Living   4 1 1  2  2   1      Past Medical History  Diagnosis Date  . History of pneumonia   . Nonspecific reaction to tuberculin skin test without active tuberculosis   . Hypercholesterolemia   . Borderline diabetes   . GERD (gastroesophageal reflux disease)   . Constipation   . Lumbago   . History of depression   . Depression   . Migraine   . Diabetes mellitus     borderline, no med  . Anxiety    Past Surgical History  Procedure Laterality Date  . Breast reduction surgery  1995  . Appendectomy  age 38  . Tonsillectomy  age 38   Family History: family history includes Allergies in her sister; Arthritis in her mother; Asthma in her sister; Breast cancer in her maternal aunt; COPD in her maternal grandfather; Cancer in her maternal aunt and paternal grandfather; Clotting disorder in her maternal grandmother; Diabetes in her father; Emphysema in her maternal grandfather; Heart disease in her father, maternal grandfather, maternal grandmother, paternal grandfather, and paternal grandmother; Hyperlipidemia in her mother; Hypertension in her mother; Mental illness in her mother; Rheum arthritis in her maternal grandmother; Seizures in her sister. Social History:  reports that she has never smoked. She has never used smokeless tobacco. She reports that she drinks alcohol. She reports that she does not use illicit drugs.   Prenatal Transfer Tool  Maternal Diabetes: No Genetic Screening: Normal Maternal Ultrasounds/Referrals:  Normal Fetal Ultrasounds or other Referrals:  None Maternal Substance Abuse:  No Significant Maternal Medications:  None Significant Maternal Lab Results:  Lab values include: Group B Strep negative Other Comments:  None  ROS    Last menstrual period 08/04/2013, not currently breastfeeding. Maternal Exam:  Uterine Assessment: Contraction strength is mild.  Contraction frequency is irregular.   Abdomen: Patient reports no abdominal tenderness. Fundal height is c/w dates.   Estimated fetal weight is 8.   Fetal presentation: vertex  Pelvis: adequate for delivery.   Cervix: 3 cm 50 %  Fetal Exam Fetal State Assessment: Category I - tracings are normal.     Physical Exam  Prenatal labs: ABO, Rh: A/Positive/-- (12/05 0000) Antibody: Negative (12/05 0000) Rubella: Immune (12/05 0000) RPR: Nonreactive (12/05 0000)  HBsAg: Negative (12/05 0000)  HIV: Non-reactive (12/05 0000)  GBS: Negative (06/24 0000)   Assessment/Plan: Intrauterine pregnancy at term for induction Routine labor and delivery Risk of pitocin discussed   Jody [name] S 05/12/2014, 7:56 AM

## 2014-05-12 NOTE — Progress Notes (Signed)
Patient ID: Jody [name] [name], female   DOB: [birth year], 38 y.o.   MRN: [id number] ON 8 MU OF PITOCIN JUST GOT EPIDURAL SOME BP DROP FHR 150'S A LITTLE FLAT NOW PROBABLY FROM BP BEFORE REACTIVE NO DECELS NOW CERVIX 5 CM 80% DID HAVE 2+ MECOMIUM STAINED FLUID DISCUSSED WITH PATIENT

## 2014-05-12 NOTE — Anesthesia Preprocedure Evaluation (Signed)
Anesthesia Evaluation  Patient identified by MRN, date of birth, ID band Patient awake    Reviewed: Allergy & Precautions, H&P , Patient's Chart, lab work & pertinent test results  Airway Mallampati: III TM Distance: >3 FB Neck ROM: Full    Dental no notable dental hx. (+) Teeth Intact   Pulmonary pneumonia -, resolved,  breath sounds clear to auscultation  Pulmonary exam normal       Cardiovascular negative cardio ROS  Rhythm:Regular Rate:Normal     Neuro/Psych  Headaches, PSYCHIATRIC DISORDERS Anxiety Depression  Neuromuscular disease    GI/Hepatic Neg liver ROS, GERD-  Medicated and Controlled,  Endo/Other  diabetesBorderline DM Obesity  Renal/GU negative Renal ROS  negative genitourinary   Musculoskeletal   Abdominal (+) + obese,   Peds  Hematology negative hematology ROS (+)   Anesthesia Other Findings   Reproductive/Obstetrics (+) Pregnancy                           Anesthesia Physical Anesthesia Plan  ASA: II  Anesthesia Plan: Epidural   Post-op Pain Management:    Induction:   Airway Management Planned: Natural Airway  Additional Equipment:   Intra-op Plan:   Post-operative Plan:   Informed Consent: I have reviewed the patients History and Physical, chart, labs and discussed the procedure including the risks, benefits and alternatives for the proposed anesthesia with the patient or authorized representative who has indicated his/her understanding and acceptance.     Plan Discussed with: Anesthesiologist  Anesthesia Plan Comments:         Anesthesia Quick Evaluation

## 2014-05-12 NOTE — Progress Notes (Signed)

## 2014-05-13 LAB — CBC
HCT: 32.8 % — ABNORMAL LOW (ref 36.0–46.0)
HEMOGLOBIN: 11.1 g/dL — AB (ref 12.0–15.0)
MCH: 29.3 pg (ref 26.0–34.0)
MCHC: 33.8 g/dL (ref 30.0–36.0)
MCV: 86.5 fL (ref 78.0–100.0)
Platelets: 237 10*3/uL (ref 150–400)
RBC: 3.79 MIL/uL — AB (ref 3.87–5.11)
RDW: 13.8 % (ref 11.5–15.5)
WBC: 13.2 10*3/uL — ABNORMAL HIGH (ref 4.0–10.5)

## 2014-05-13 NOTE — Anesthesia Postprocedure Evaluation (Signed)
  Anesthesia Post-op Note  Patient: Jody [name]  Procedure(s) Performed: * No procedures listed *  Patient Location: Mother/Baby  Anesthesia Type:Epidural  Level of Consciousness: awake  Airway and Oxygen Therapy: Patient Spontanous Breathing  Post-op Pain: mild  Post-op Assessment: Patient's Cardiovascular Status Stable and Respiratory Function Stable  Post-op Vital Signs: stable  Last Vitals:  Filed Vitals:   05/13/14 0830  BP: 112/73  Pulse: 95  Temp: 36.8 C  Resp: 18    Complications: No apparent anesthesia complications

## 2014-05-13 NOTE — Progress Notes (Signed)
Post Partum Day 1 Subjective: no complaints, up ad lib, voiding and tolerating PO  Objective: Blood pressure 121/81, pulse 98, temperature 98.7 F (37.1 C), temperature source Oral, resp. rate 18, height 5\' 4"  (1.626 m), weight 220 lb (99.791 kg), last menstrual period 08/04/2013, SpO2 98.00%, unknown if currently breastfeeding.  Physical Exam:  General: alert and cooperative Lochia: appropriate Uterine Fundus: firm Incision: perineum intact DVT Evaluation: No evidence of DVT seen on physical exam. Negative Homan's sign. No cords or calf tenderness. No significant calf/ankle edema.   Recent Labs  05/12/14 0800 05/13/14 0600  HGB 12.3 11.1*  HCT 36.3 32.8*    Assessment/Plan: Plan for discharge tomorrow   LOS: 1 day   CURTIS,CAROL G 05/13/2014, 8:02 AM

## 2014-05-14 MED ORDER — OXYCODONE-ACETAMINOPHEN 5-325 MG PO TABS: 1.0000 | ORAL_TABLET | ORAL | Status: DC | PRN

## 2014-05-14 MED ORDER — IBUPROFEN 600 MG PO TABS: 600.0000 mg | ORAL_TABLET | Freq: Four times a day (QID) | ORAL | Status: DC | PRN

## 2014-05-14 NOTE — Discharge Summary (Signed)
Obstetric Discharge Summary Reason for Admission: induction of labor Prenatal Procedures: none Intrapartum Procedures: spontaneous vaginal delivery Postpartum Procedures: none Complications-Operative and Postpartum: 2nd degree perineal laceration Hemoglobin  Date Value Ref Range Status  05/13/2014 11.1* 12.0 - 15.0 g/dL Final     HCT  Date Value Ref Range Status  05/13/2014 32.8* 36.0 - 46.0 % Final    Physical Exam:  General: alert, cooperative and appears stated age 78Lochia: appropriate Uterine Fundus: firm Incision: healing well, no significant drainage, no dehiscence, no significant erythema DVT Evaluation: No evidence of DVT seen on physical exam.  Discharge Diagnoses: Term Pregnancy-delivered  Discharge Information: Date: 05/14/2014 Activity: pelvic rest Diet: routine Medications: Ibuprofen and Percocet Condition: stable Instructions: refer to practice specific booklet Discharge to: home   Newborn Data: Live born female  Birth Weight: 9 lb 9.6 oz (4355 g) APGAR: 4, 9  Home with mother.  Jody [name] L 05/14/2014, 8:00 AM

## 2014-06-23 LAB — HM PAP SMEAR: HM PAP: NORMAL

## 2014-07-16 ENCOUNTER — Encounter: Payer: Self-pay | Admitting: Family Medicine

## 2014-07-16 ENCOUNTER — Ambulatory Visit (INDEPENDENT_AMBULATORY_CARE_PROVIDER_SITE_OTHER): Payer: 59 | Admitting: Family Medicine

## 2014-07-16 VITALS — BP 120/76 | HR 92 | Temp 98.0°F | Resp 16 | Wt 216.0 lb

## 2014-07-16 DIAGNOSIS — F329 Major depressive disorder, single episode, unspecified: Secondary | ICD-10-CM

## 2014-07-16 DIAGNOSIS — F411 Generalized anxiety disorder: Secondary | ICD-10-CM

## 2014-07-16 DIAGNOSIS — F3289 Other specified depressive episodes: Secondary | ICD-10-CM

## 2014-07-16 MED ORDER — ALPRAZOLAM 0.5 MG PO TABS: 0.5000 mg | ORAL_TABLET | Freq: Two times a day (BID) | ORAL | Status: DC

## 2014-07-16 NOTE — Patient Instructions (Signed)
Follow up in 1-2 weeks to recheck mood Use the xanax as needed in addition to the Zoloft Make yourself take some time away- you can do this! Call with any questions or concerns Bring the FMLA forms once you have them CONGRATS!  She's beautiful!

## 2014-07-16 NOTE — Progress Notes (Signed)
Pre visit review using our clinic review tool, if applicable. No additional management support is needed unless otherwise documented below in the visit note. 

## 2014-07-16 NOTE — Assessment & Plan Note (Signed)
Pt has hx of similar.  Asking for refill on xanax.  Pt is not breastfeeding- refill provided.

## 2014-07-16 NOTE — Progress Notes (Signed)
   Subjective:    Patient ID: Jody [name], female    DOB: 11/04/1976, 38 y.o.   MRN: [id number]  HPI Post-partum depression- pt has hx of depression.  Did not have post-partum w/ son, but 'everything's different'.  Was started on Zoloft by OB.  No thoughts of harming herself or baby.  Pt reports frequent crying, unwilling to leave baby- even w/ husband.  Pt is supposed to return to work on 9/23.  Pt was going to take 12 weeks of leave, but return to work part-time after 6 weeks (this has not happened yet).  Pt has abnormal pap- biopsy scheduled in October.  Pt very overwhelmed w/ all that's going on.  Was able to let husband put baby to bed last night and go to bed early.   Review of Systems For ROS see HPI     Objective:   Physical Exam  Vitals reviewed. Constitutional: She is oriented to person, place, and time. She appears well-developed and well-nourished. No distress.  HENT:  Head: Normocephalic and atraumatic.  Neurological: She is alert and oriented to person, place, and time.  Psychiatric: She has a normal mood and affect. Her behavior is normal. Thought content normal.          Assessment & Plan:

## 2014-07-16 NOTE — Assessment & Plan Note (Signed)
Deteriorated in the post-partum period.  Started on Zoloft by OB ~1 week ago.  Pt reports this has helped take the edge off and sxs are improving.  No thoughts of harming herself or baby.  Will follow closely.

## 2014-07-27 ENCOUNTER — Ambulatory Visit (INDEPENDENT_AMBULATORY_CARE_PROVIDER_SITE_OTHER): Payer: 59 | Admitting: Family Medicine

## 2014-07-27 ENCOUNTER — Encounter: Payer: Self-pay | Admitting: Family Medicine

## 2014-07-27 ENCOUNTER — Ambulatory Visit: Payer: 59 | Admitting: Family Medicine

## 2014-07-27 VITALS — BP 118/80 | HR 84 | Temp 98.0°F | Resp 16 | Wt 197.1 lb

## 2014-07-27 DIAGNOSIS — F3289 Other specified depressive episodes: Secondary | ICD-10-CM

## 2014-07-27 DIAGNOSIS — F329 Major depressive disorder, single episode, unspecified: Secondary | ICD-10-CM

## 2014-07-27 MED ORDER — SERTRALINE HCL 50 MG PO TABS: 50.0000 mg | ORAL_TABLET | Freq: Every day | ORAL | Status: DC

## 2014-07-27 NOTE — Progress Notes (Signed)
Pre visit review using our clinic review tool, if applicable. No additional management support is needed unless otherwise documented below in the visit note. 

## 2014-07-27 NOTE — Patient Instructions (Signed)
Follow up as needed Call with any questions or concerns Continue the Zoloft daily YOU CAN DO THIS!!!

## 2014-07-27 NOTE — Progress Notes (Signed)
   Subjective:    Patient ID: Jody [name], female    DOB: May 24, 1976, 38 y.o.   MRN: [id number]  HPI Postpartum- today was baby's 1st day of daycare.  Pt did ok and cried afterwards.  Pt feels Zoloft is helpful.  Sleeping better.  Less tearful.  Willing to let others hold and take care her.  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   Review of Systems For ROS see HPI     Objective:   Physical Exam  Vitals reviewed. Constitutional: She is oriented to person, place, and time. She appears well-developed and well-nourished. No distress.  Neurological: She is alert and oriented to person, place, and time.  Skin: Skin is warm and dry.  Psychiatric: She has a normal mood and affect. Her behavior is normal. Thought content normal.          Assessment & Plan:

## 2014-07-28 NOTE — Assessment & Plan Note (Signed)
Improved since starting Zoloft.  Forms completed for FMLA.  Pt to return to work full time w/ no restrictions on 9/30.

## 2014-07-30 ENCOUNTER — Telehealth: Payer: Self-pay

## 2014-07-30 NOTE — Telephone Encounter (Signed)
FMLA paperwork received via fax on 07/22/14.  Paperwork completed and signed by Dr. Beverely Low.  Forms faxed to [employer] on 07/27/14 to [phone].  Fax confirmation received.  Forms placed in scan basket for scanning.

## 2014-08-04 ENCOUNTER — Ambulatory Visit: Payer: 59 | Admitting: Family Medicine

## 2014-08-06 ENCOUNTER — Telehealth: Payer: Self-pay | Admitting: *Deleted

## 2014-08-06 ENCOUNTER — Ambulatory Visit (INDEPENDENT_AMBULATORY_CARE_PROVIDER_SITE_OTHER): Payer: 59 | Admitting: Family Medicine

## 2014-08-06 ENCOUNTER — Encounter: Payer: Self-pay | Admitting: Family Medicine

## 2014-08-06 VITALS — BP 118/80 | HR 89 | Temp 98.0°F | Resp 16 | Wt 195.5 lb

## 2014-08-06 DIAGNOSIS — G43009 Migraine without aura, not intractable, without status migrainosus: Secondary | ICD-10-CM

## 2014-08-06 DIAGNOSIS — Q828 Other specified congenital malformations of skin: Secondary | ICD-10-CM

## 2014-08-06 DIAGNOSIS — Z7689 Persons encountering health services in other specified circumstances: Secondary | ICD-10-CM

## 2014-08-06 DIAGNOSIS — F411 Generalized anxiety disorder: Secondary | ICD-10-CM

## 2014-08-06 NOTE — Telephone Encounter (Signed)
Received FMLA forms via fax from [employer] for patient. Forms given to Dr. Beverely Lowabori for completion during patient's OV today. JG//CMA

## 2014-08-06 NOTE — Progress Notes (Signed)
Pre visit review using our clinic review tool, if applicable. No additional management support is needed unless otherwise documented below in the visit note. 

## 2014-08-06 NOTE — Progress Notes (Signed)
   Subjective:    Patient ID: Jody [name] [name], female    DOB: 12/16/1975, 38 y.o.   MRN: [id number]  HPI Skin tags- pt reports tags have increased in # and size during pregnancy.  Migraines- pt has had decrease in frequency due to pregnancy.  Needs renewal of FMLA.  Pt aware that migraine frequency may again change as hormones change.   Review of Systems For ROS see HPI     Objective:   Physical Exam  Vitals reviewed. Constitutional: She is oriented to person, place, and time. She appears well-developed and well-nourished. No distress.  Neurological: She is alert and oriented to person, place, and time.  Skin: Skin is warm and dry.  Multiple small scattered skin tags on neck  Psychiatric: She has a normal mood and affect. Her behavior is normal. Thought content normal.          Assessment & Plan:

## 2014-08-06 NOTE — Patient Instructions (Signed)
Follow up as needed I'll update the FMLA Headache forms If the skin tags really bother you, Derm can zap them off The scattered mom brain will improve w/ a return to your daily routine, check lists, and REST! Call with any questions or concerns Hang in there!!

## 2014-08-10 NOTE — Assessment & Plan Note (Signed)
Improving.  Will continue to follow.

## 2014-08-10 NOTE — Assessment & Plan Note (Signed)
Pt has hx of similar.  Needs FMLA renewal.  Forms to be completed.

## 2014-08-10 NOTE — Assessment & Plan Note (Signed)
New.  Increased in number and size during pregnancy.  Reviewed that this is not uncommon due to hormonal changes.  Pt to f/u w/ derm if she desires removal.  Pt expressed understanding and is in agreement w/ plan.

## 2014-08-12 NOTE — Telephone Encounter (Signed)
FMLA forms faxed to [employer] at [phone]. Fax confirmation received. JG//CMA

## 2014-08-18 ENCOUNTER — Telehealth: Payer: Self-pay | Admitting: Family Medicine

## 2014-08-18 MED ORDER — POLYMYXIN B-TRIMETHOPRIM 10000-0.1 UNIT/ML-% OP SOLN: OPHTHALMIC | Status: DC

## 2014-08-18 NOTE — Telephone Encounter (Signed)
Spoke with pt advised that son was diagnosed with pink eye and is currently on Abx. Pt would like something sent downstairs to med center due to itching and burning sensation in her eyes. That wasy she does not have to use his drops.

## 2014-08-18 NOTE — Telephone Encounter (Signed)
Caller name: Lenard ForthBennett, Ammie Relation to pt: self  Call back number: [phone]   Reason for call:   Pt has been exposed to conjunctivitis pt would like to discuss. i advised patient before Dr. Beverely Lowabori can send any RX she needs to be DX she insisted to speak with Shanda BumpsJessica.

## 2014-08-18 NOTE — Telephone Encounter (Signed)
Med filled.  

## 2014-08-18 NOTE — Telephone Encounter (Signed)
Ok for Polytrim 2 drops in affected eye TID, Disp 10 ml, no refills.  This is a 1 time courtesy since pt's son is already on abx and she has a newborn.  Next time will need OV

## 2014-09-07 ENCOUNTER — Encounter: Payer: Self-pay | Admitting: Family Medicine

## 2014-09-30 ENCOUNTER — Encounter: Payer: Self-pay | Admitting: Family Medicine

## 2014-10-05 ENCOUNTER — Ambulatory Visit (INDEPENDENT_AMBULATORY_CARE_PROVIDER_SITE_OTHER): Payer: 59 | Admitting: Physician Assistant

## 2014-10-05 ENCOUNTER — Encounter: Payer: Self-pay | Admitting: Physician Assistant

## 2014-10-05 VITALS — BP 135/70 | HR 85 | Temp 99.1°F | Resp 16 | Ht 64.0 in | Wt 204.4 lb

## 2014-10-05 DIAGNOSIS — B029 Zoster without complications: Secondary | ICD-10-CM | POA: Insufficient documentation

## 2014-10-05 DIAGNOSIS — B9689 Other specified bacterial agents as the cause of diseases classified elsewhere: Secondary | ICD-10-CM | POA: Insufficient documentation

## 2014-10-05 DIAGNOSIS — J208 Acute bronchitis due to other specified organisms: Principal | ICD-10-CM

## 2014-10-05 DIAGNOSIS — J00 Acute nasopharyngitis [common cold]: Secondary | ICD-10-CM

## 2014-10-05 MED ORDER — HYDROCOD POLST-CHLORPHEN POLST 10-8 MG/5ML PO LQCR: 5.0000 mL | Freq: Two times a day (BID) | ORAL | Status: DC | PRN

## 2014-10-05 MED ORDER — VALACYCLOVIR HCL 1 G PO TABS: 1000.0000 mg | ORAL_TABLET | Freq: Three times a day (TID) | ORAL | Status: DC

## 2014-10-05 MED ORDER — HYDROCORTISONE ACE-PRAMOXINE 2.5-1 % RE CREA: 1.0000 "application " | TOPICAL_CREAM | Freq: Three times a day (TID) | RECTAL | Status: AC

## 2014-10-05 MED ORDER — AZITHROMYCIN 250 MG PO TABS: ORAL_TABLET | ORAL | Status: DC

## 2014-10-05 NOTE — Progress Notes (Signed)
Patient presents to clinic today c/o chest congestion, worsening productive cough.  Sputum is green-colored.  Endorses low-grade fever at home.  Endorses chest tenderness but denies pleuritic chest pain.  Denies SOB or history of asthma.  Denies recent travel or sick contact.  Denies smoking.  Patient endorses painful and itchy rash of left upper arm starting this morning. Endorses history of chicken pox but denies history of shingles.  Denies fever, chills.  Rash is blistering.  Denies rash elsewhere.  Past Medical History  Diagnosis Date  . History of pneumonia   . Nonspecific reaction to tuberculin skin test without active tuberculosis   . Hypercholesterolemia   . Borderline diabetes   . GERD (gastroesophageal reflux disease)   . Constipation   . Lumbago   . History of depression   . Depression   . Migraine   . Diabetes mellitus     borderline, no med  . Anxiety     Current Outpatient Prescriptions on File Prior to Visit  Medication Sig Dispense Refill  . ALPRAZolam (XANAX) 0.5 MG tablet Take 1 tablet (0.5 mg total) by mouth 2 (two) times daily. 60 tablet 1  . sertraline (ZOLOFT) 50 MG tablet Take 1 tablet (50 mg total) by mouth daily. 30 tablet 6   No current facility-administered medications on file prior to visit.    Allergies  Allergen Reactions  . Sulfonamide Derivatives Rash    Family History  Problem Relation Age of Onset  . Emphysema Maternal Grandfather   . COPD Maternal Grandfather   . Heart disease Maternal Grandfather   . Allergies Sister   . Asthma Sister   . Seizures Sister   . Heart disease Father   . Diabetes Father   . Heart disease Maternal Grandmother   . Clotting disorder Maternal Grandmother   . Rheum arthritis Maternal Grandmother   . Breast cancer Maternal Aunt   . Cancer Maternal Aunt   . Arthritis Mother   . Hyperlipidemia Mother   . Hypertension Mother   . Mental illness Mother   . Heart disease Paternal Grandmother   . Cancer  Paternal Grandfather     prostate  . Heart disease Paternal Grandfather     History   Social History  . Marital Status: Married    Spouse Name: N/A    Number of Children: 0  . Years of Education: N/A   Occupational History  . works for [employer]    Social History Main Topics  . Smoking status: Never Smoker   . Smokeless tobacco: Never Used  . Alcohol Use: Yes     Comment: occasional  . Drug Use: No  . Sexual Activity: Yes   Other Topics Concern  . None   Social History Narrative   Separated, but in a relationship   Review of Systems - See HPI.  All other ROS are negative.  BP 135/70 mmHg  Pulse 85  Temp(Src) 99.1 F (37.3 C) (Oral)  Resp 16  Ht 5\' 4"  (1.626 m)  Wt 204 lb 6 oz (92.704 kg)  BMI 35.06 kg/m2  SpO2 99%  LMP 09/14/2014  Physical Exam  Constitutional: She is oriented to person, place, and time and well-developed, well-nourished, and in no distress.  HENT:  Head: Normocephalic and atraumatic.  Right Ear: External ear normal.  Left Ear: External ear normal.  Nose: Nose normal.  Mouth/Throat: Oropharynx is clear and moist. No oropharyngeal exudate.  Eyes: Conjunctivae are normal. Pupils are equal, round, and reactive  to light.  Neck: Neck supple.  Cardiovascular: Normal rate, regular rhythm, normal heart sounds and intact distal pulses.   Pulmonary/Chest: Effort normal and breath sounds normal. No respiratory distress. She has no wheezes. She has no rales. She exhibits no tenderness.  Lymphadenopathy:    She has no cervical adenopathy.  Neurological: She is alert and oriented to person, place, and time.  Skin: Skin is warm and dry. Rash noted.  Presence of a vesicular rash of left upper anterior arm, without excoriation or drainage. Does not cross midline.  Psychiatric: Affect normal.  Vitals reviewed.  Assessment/Plan: Shingles rash Rx Valtrex. OTC pain medications as needed. Discussed measures to prevent spread of infection.  Handout  given.  Follow-up in 1 week if symptoms not improving.  Acute bacterial bronchitis Rx Azithromycin.  Rx Tussionex for cough. Increase fluids.  Rest.  Saline nasal spray. Mucinex. Place a humidifier in the bedroom.  Return precautions discussed with patient.

## 2014-10-05 NOTE — Assessment & Plan Note (Signed)
Rx Valtrex. OTC pain medications as needed. Discussed measures to prevent spread of infection.  Handout given.  Follow-up in 1 week if symptoms not improving.

## 2014-10-05 NOTE — Patient Instructions (Addendum)
For bronchitis -- please take Azithromycin as directed.  Increase fluids.  Use Tussionex as directed for cough. Continue the Mucinex.  Place a humidifier in the bedroom.  For rash -- this seems like a developing shingles rash. Please take Valtrex as directed. Keep the rash covered as you can transmit the chicken pox virus to your children while blisters are present.  Avoid close contact with them as possible.  Keep your hands washed.    Shingles Shingles is caused by the same virus that causes chickenpox. The first feelings may be pain or tingling. A rash will follow in a couple days. The rash may occur on any area of the body. Long-lasting pain is more likely in an elderly person. It can last months to years. There are medicines that can help prevent pain if you start taking them early. HOME CARE   Take cool baths or place cool cloths on the rash as told by your doctor.  Take medicine only as told by your doctor.  Rest as told by your doctor.  Keep your rash clean with mild soap and cool water or as told by your doctor.  Do not scratch your rash. You may use calamine lotion to relieve itchy skin as told by your doctor.  Keep your rash covered with a loose bandage (dressing).  Avoid touching:  Babies.  Pregnant women.  Children with inflamed skin (eczema).  People who have gotten organ transplants.  People with chronic illnesses, such as leukemia or AIDS.  Wear loose-fitting clothing.  If the rash is on the face, you may need to see a specialist. Keep all appointments. Shingles must be kept away from the eyes, if possible.  Keep all follow-up visits as told by your doctor. GET HELP RIGHT AWAY IF:   You have any pain on the face or eye.  You lose feeling on one side of your face.  You have ear pain or ringing in your ear.  You cannot taste as well.  Your medicines do not help the pain.  Your redness or puffiness (swelling) spreads.  You feel like you are getting  worse.  You have a fever. MAKE SURE YOU:   Understand these instructions.  Will watch your condition.  Will get help right away if you are not doing well or get worse. Document Released: 04/10/2008 Document Revised: 03/09/2014 Document Reviewed: 04/10/2008 [facility] Patient Information 2015 [employer] [place]. This information is not intended to replace advice given to you by your health care provider. Make sure you discuss any questions you have with your health care provider.

## 2014-10-05 NOTE — Assessment & Plan Note (Signed)
Rx Azithromycin.  Rx Tussionex for cough. Increase fluids.  Rest.  Saline nasal spray. Mucinex. Place a humidifier in the bedroom.  Return precautions discussed with patient.

## 2014-10-05 NOTE — Progress Notes (Signed)
Pre visit review using our clinic review tool, if applicable. No additional management support is needed unless otherwise documented below in the visit note/SLS  

## 2014-10-06 ENCOUNTER — Telehealth: Payer: Self-pay

## 2014-10-06 ENCOUNTER — Ambulatory Visit: Payer: 59 | Admitting: Physician Assistant

## 2014-10-06 NOTE — Telephone Encounter (Signed)
Jody [name] [name] [phone]  Jody ArnoldStacy needs work note to go back to work and she feel like Thursday they will be drying up enough for her to go back. Employee health requires that they be crusted over and dryed up, will a nurse or doctor have to verify this.Call when ready.

## 2014-10-07 ENCOUNTER — Encounter: Payer: Self-pay | Admitting: Medical

## 2014-10-07 ENCOUNTER — Ambulatory Visit (INDEPENDENT_AMBULATORY_CARE_PROVIDER_SITE_OTHER): Payer: 59 | Admitting: Medical

## 2014-10-07 VITALS — BP 140/82 | HR 97 | Temp 98.4°F | Ht 64.0 in | Wt 207.2 lb

## 2014-10-07 DIAGNOSIS — B029 Zoster without complications: Secondary | ICD-10-CM

## 2014-10-07 NOTE — Telephone Encounter (Signed)
If required by work, she will need reassessment.  Have her schedule appointment so we can verify.

## 2014-10-07 NOTE — Progress Notes (Signed)
Pre visit review using our clinic review tool, if applicable. No additional management support is needed unless otherwise documented below in the visit note. 

## 2014-10-07 NOTE — Telephone Encounter (Signed)
Patient called back. Scheduled appointment for this afternoon at 4:30 to see Jody [name]. Cody's schedule already full.

## 2014-10-07 NOTE — Assessment & Plan Note (Signed)
I have talked with employee health and you do meet the criteria to return to work. If you get any recurrent vesicles or any drainage then let us know and would write you out for more days.  Keep area covered with band aid and wear  long sleeve shirts.  Follow up in 10 days if area not back to normal.

## 2014-10-07 NOTE — Progress Notes (Signed)
Subjective:    Patient ID: Jody [name] [name], female    DOB: [birth year], 38 y.o.   MRN: [id number]  HPI  Pt was diagnosed with shingles on her rt arm. Diagnosed on Monday. Pt had small blisters on Saturday. The vesicles were already burst by time she saw [place]. No new lesion. Pt is on 7 days or valtrex. Pt has 2 children.  Pt works in [employer] dept. Pt state that she does not have direct contact to patient.   Pt is using a large band aid over the area.  Past Medical History  Diagnosis Date  . History of pneumonia   . Nonspecific reaction to tuberculin skin test without active tuberculosis   . Hypercholesterolemia   . Borderline diabetes   . GERD (gastroesophageal reflux disease)   . Constipation   . Lumbago   . History of depression   . Depression   . Migraine   . Diabetes mellitus     borderline, no med  . Anxiety     History   Social History  . Marital Status: Married    Spouse Name: N/A    Number of Children: 0  . Years of Education: N/A   Occupational History  . works for [employer]    Social History Main Topics  . Smoking status: Never Smoker   . Smokeless tobacco: Never Used  . Alcohol Use: Yes     Comment: occasional  . Drug Use: No  . Sexual Activity: Yes   Other Topics Concern  . Not on file   Social History Narrative   Separated, but in a relationship    Past Surgical History  Procedure Laterality Date  . Breast reduction surgery  1995  . Appendectomy  age 38  . Tonsillectomy  age 38    Family History  Problem Relation Age of Onset  . Emphysema Maternal Grandfather   . COPD Maternal Grandfather   . Heart disease Maternal Grandfather   . Allergies Sister   . Asthma Sister   . Seizures Sister   . Heart disease Father   . Diabetes Father   . Heart disease Maternal Grandmother   . Clotting disorder Maternal Grandmother   . Rheum arthritis Maternal Grandmother   . Breast cancer Maternal Aunt   . Cancer Maternal Aunt   . Arthritis  Mother   . Hyperlipidemia Mother   . Hypertension Mother   . Mental illness Mother   . Heart disease Paternal Grandmother   . Cancer Paternal Grandfather     prostate  . Heart disease Paternal Grandfather     Allergies  Allergen Reactions  . Sulfonamide Derivatives Rash    Current Outpatient Prescriptions on File Prior to Visit  Medication Sig Dispense Refill  . ALPRAZolam (XANAX) 0.5 MG tablet Take 1 tablet (0.5 mg total) by mouth 2 (two) times daily. 60 tablet 1  . azithromycin (ZITHROMAX) 250 MG tablet Take 2 tablets on Day 1.  Then take 1 tablet daily. 6 tablet 0  . chlorpheniramine-HYDROcodone (TUSSIONEX PENNKINETIC ER) 10-8 MG/5ML LQCR Take 5 mLs by mouth every 12 (twelve) hours as needed. 115 mL 0  . hydrocortisone-pramoxine (ANALPRAM HC) 2.5-1 % rectal cream Place 1 application rectally 3 (three) times daily. 30 g 0  . sertraline (ZOLOFT) 50 MG tablet Take 1 tablet (50 mg total) by mouth daily. 30 tablet 6  . valACYclovir (VALTREX) 1000 MG tablet Take 1 tablet (1,000 mg total) by mouth 3 (three) times daily. 21 tablet 0  No current facility-administered medications on file prior to visit.    BP 140/82 mmHg  Pulse 97  Temp(Src) 98.4 F (36.9 C) (Oral)  Ht 5\' 4"  (1.626 m)  Wt 207 lb 3.2 oz (93.985 kg)  BMI 35.55 kg/m2  SpO2 95%  LMP 09/14/2014      Review of Systems  Constitutional: Negative for fever, chills and fatigue.  Respiratory: Negative for cough, chest tightness, shortness of breath and wheezing.   Cardiovascular: Negative for chest pain and palpitations.  Gastrointestinal: Positive for abdominal pain.  Musculoskeletal: Positive for joint swelling.       No rt arm pain.  Skin:       Skin rt arm looks and feels better. No vesicles. Dry areas where prior vesicles were.  Neurological: Negative for dizziness, tremors, seizures, syncope, weakness, light-headedness, numbness and headaches.  Hematological: Negative for adenopathy. Does not bruise/bleed  easily.          Objective:   Physical Exam  Skin Rt medial arm. 9 small areas where apparent vesicles  have ruptured and healing . Areas are  dry. But no scabbing. No weeping areas. No discharge. No surrounding redness. No warmth or tendernss.       Assessment & Plan:

## 2014-10-07 NOTE — Patient Instructions (Addendum)
I have talked with employee health and you do meet the criteria to return to work. If you get any recurrent vesicles or any drainage then let us know and would write you out for more days.  Keep area covered with band aid and wear  long sleeve shirts.  Follow up in 10 days if area not back to normal.  Pt advised continue antiviral.

## 2014-10-12 ENCOUNTER — Encounter: Payer: Self-pay | Admitting: Family Medicine

## 2014-10-12 NOTE — Telephone Encounter (Signed)
Advise pt to come in recheck her shingles area. She needs evaluation for new problems. I don't think all of this from valtrex.

## 2014-10-14 ENCOUNTER — Telehealth: Payer: Self-pay

## 2014-10-14 NOTE — Telephone Encounter (Signed)
Called and left message on patient's phone yesterday and today.  Awaiting patient to return call.  When patient calls back, please schedule an appointment if patient still needs to be seen.

## 2014-10-21 ENCOUNTER — Other Ambulatory Visit: Payer: Self-pay | Admitting: Physician Assistant

## 2014-10-21 ENCOUNTER — Telehealth: Payer: Self-pay | Admitting: Family

## 2014-10-21 DIAGNOSIS — J069 Acute upper respiratory infection, unspecified: Secondary | ICD-10-CM

## 2014-10-21 MED ORDER — AMOXICILLIN-POT CLAVULANATE 875-125 MG PO TABS: 1.0000 | ORAL_TABLET | Freq: Two times a day (BID) | ORAL | Status: DC

## 2014-10-21 MED ORDER — BENZONATATE 100 MG PO CAPS: 100.0000 mg | ORAL_CAPSULE | Freq: Two times a day (BID) | ORAL | Status: DC | PRN

## 2014-10-21 NOTE — Progress Notes (Signed)
We are sorry that you are not feeling well.  Here is how we plan to help!  Based on what you have shared with me it looks like you have upper respiratory tract inflammation that has resulted in a signification cough.  Inflammation and infection in the upper respiratory tract is commonly called bronchitis and has four common causes:  Allergies, Viral Infections, Acid Reflux and Bacterial Infections.  Allergies, viruses and acid reflux are treated by controlling symptoms or eliminating the cause. An example might be a cough caused by taking certain blood pressure medications. You stop the cough by changing the medication. Another example might be a cough caused by acid reflux. Controlling the reflux helps control the cough.  Based on your presentation I believe you most likely have A cough due to bacteria.  When patients have a fever and a productive cough with a change in color or increased sputum production, we are concerned about bacterial bronchitis.  If left untreated it can progress to pneumonia.  If your symptoms do not improve with your treatment plan it is important that you contact your provider.   I have prescribed Augmentin 875-125 mg twice a day for seven days.   In addition you may use A prescription cough medication called Tessalon Perles 100mg . You may take 1-2 capsules every 8 hours as needed for your cough.    HOME CARE . Only take medications as instructed by your medical team. . Complete the entire course of an antibiotic. . Drink plenty of fluids and get plenty of rest. . Avoid close contacts especially the very young and the elderly . Cover your mouth if you cough or cough into your sleeve. . Always remember to wash your hands . A steam or ultrasonic humidifier can help congestion.    GET HELP RIGHT AWAY IF: . You develop worsening fever. . You become short of breath . You cough up blood. . Your symptoms persist after you have completed your treatment plan MAKE SURE YOU    Understand these instructions.  Will watch your condition.  Will get help right away if you are not doing well or get worse.  Your e-visit answers were reviewed by a board certified advanced clinical practitioner to complete your personal care plan.  Depending on the condition, your plan could have included both over the counter or prescription medications.  Please review your pharmacy choice.  If there is a problem, you may call our nursing hot line at [phone] and have the prescription routed to another pharmacy.  Your safety is important to us.  If you have drug allergies check your prescription carefully.    You can use MyChart to ask questions about today's visit, request a non-urgent call back, or ask for a work or school excuse.  You will get an e-mail in the next two days asking about your experience.  I hope that your e-visit has been valuable and will speed your recovery. Thank you for using e-visits.

## 2014-10-22 NOTE — Telephone Encounter (Signed)
Shingles rash has dried and crushed over.  Diarrhea has resolved.  Still has hemorrhoids, but they are being treated with hydrocortisone-pramoxine (ANALPRAM HC) 2.5-1 % rectal cream.  No needs voiced at this time.

## 2014-10-23 ENCOUNTER — Other Ambulatory Visit: Payer: Self-pay | Admitting: Family Medicine

## 2014-10-23 NOTE — Telephone Encounter (Signed)
Last ov 08-06-14  Alprazolam last filled 07-16-14 #60 with 1   [facility]

## 2014-11-01 ENCOUNTER — Telehealth: Payer: Self-pay | Admitting: Family

## 2014-11-01 DIAGNOSIS — R05 Cough: Secondary | ICD-10-CM

## 2014-11-01 DIAGNOSIS — R059 Cough, unspecified: Secondary | ICD-10-CM

## 2014-11-01 DIAGNOSIS — J069 Acute upper respiratory infection, unspecified: Secondary | ICD-10-CM

## 2014-11-01 MED ORDER — BENZONATATE 100 MG PO CAPS: 100.0000 mg | ORAL_CAPSULE | Freq: Two times a day (BID) | ORAL | Status: DC | PRN

## 2014-11-01 MED ORDER — DOXYCYCLINE HYCLATE 100 MG PO TABS: 100.0000 mg | ORAL_TABLET | Freq: Two times a day (BID) | ORAL | Status: DC

## 2014-11-01 NOTE — Progress Notes (Signed)
We are sorry that you are not feeling well.  Here is how we plan to help!  Based on what you have shared with me it looks like you have upper respiratory tract inflammation that has resulted in a signification cough.  Inflammation and infection in the upper respiratory tract is commonly called bronchitis and has four common causes:  Allergies, Viral Infections, Acid Reflux and Bacterial Infections.  Allergies, viruses and acid reflux are treated by controlling symptoms or eliminating the cause. An example might be a cough caused by taking certain blood pressure medications. You stop the cough by changing the medication. Another example might be a cough caused by acid reflux. Controlling the reflux helps control the cough.  Based on your presentation I believe you most likely have A cough due to bacteria.  When patients have a fever and a productive cough with a change in color or increased sputum production, we are concerned about bacterial bronchitis.  If left untreated it can progress to pneumonia.  If your symptoms do not improve with your treatment plan it is important that you contact your provider.   I hve prescribed Doxycycline 100 mg twice a day for 7 days    In addition you may use A non-prescription cough medication called Robitussin DAC. Take 2 teaspoons every 8 hours or Delsym: take 2 teaspoons every 12 hours., A non-prescription cough medication called Mucinex DM: take 2 tablets every 12 hours. and A prescription cough medication called Tessalon Perles 100mg . You may take 1-2 capsules every 8 hours as needed for your cough.    HOME CARE . Only take medications as instructed by your medical team. . Complete the entire course of an antibiotic. . Drink plenty of fluids and get plenty of rest. . Avoid close contacts especially the very young and the elderly . Cover your mouth if you cough or cough into your sleeve. . Always remember to wash your hands . A steam or ultrasonic humidifier can  help congestion.    GET HELP RIGHT AWAY IF: . You develop worsening fever. . You become short of breath . You cough up blood. . Your symptoms persist after you have completed your treatment plan MAKE SURE YOU   Understand these instructions.  Will watch your condition.  Will get help right away if you are not doing well or get worse.  Your e-visit answers were reviewed by a board certified advanced clinical practitioner to complete your personal care plan.  Depending on the condition, your plan could have included both over the counter or prescription medications.  If there is a problem please reply  once you have received a response from your provider.  Your safety is important to us.  If you have drug allergies check your prescription carefully.    You can use MyChart to ask questions about today's visit, request a non-urgent call back, or ask for a work or school excuse.  You will get an e-mail in the next two days asking about your experience.  I hope that your e-visit has been valuable and will speed your recovery. Thank you for using e-visits.

## 2014-11-02 ENCOUNTER — Encounter: Payer: Self-pay | Admitting: Medical

## 2014-11-02 ENCOUNTER — Encounter (HOSPITAL_BASED_OUTPATIENT_CLINIC_OR_DEPARTMENT_OTHER): Payer: Self-pay

## 2014-11-02 ENCOUNTER — Emergency Department (HOSPITAL_BASED_OUTPATIENT_CLINIC_OR_DEPARTMENT_OTHER): Payer: 59

## 2014-11-02 ENCOUNTER — Ambulatory Visit (INDEPENDENT_AMBULATORY_CARE_PROVIDER_SITE_OTHER): Payer: 59 | Admitting: Medical

## 2014-11-02 ENCOUNTER — Emergency Department (HOSPITAL_BASED_OUTPATIENT_CLINIC_OR_DEPARTMENT_OTHER)
Admission: EM | Admit: 2014-11-02 | Discharge: 2014-11-02 | Disposition: A | Discharge status: Home / Self Care | Payer: 59 | Attending: Emergency Medicine | Admitting: Emergency Medicine

## 2014-11-02 VITALS — BP 125/87 | HR 82 | Temp 98.3°F | Ht 64.0 in | Wt 201.8 lb

## 2014-11-02 DIAGNOSIS — Z8719 Personal history of other diseases of the digestive system: Secondary | ICD-10-CM | POA: Insufficient documentation

## 2014-11-02 DIAGNOSIS — B9689 Other specified bacterial agents as the cause of diseases classified elsewhere: Secondary | ICD-10-CM

## 2014-11-02 DIAGNOSIS — Z8619 Personal history of other infectious and parasitic diseases: Secondary | ICD-10-CM | POA: Insufficient documentation

## 2014-11-02 DIAGNOSIS — E119 Type 2 diabetes mellitus without complications: Secondary | ICD-10-CM | POA: Insufficient documentation

## 2014-11-02 DIAGNOSIS — Z8701 Personal history of pneumonia (recurrent): Secondary | ICD-10-CM | POA: Insufficient documentation

## 2014-11-02 DIAGNOSIS — R0789 Other chest pain: Secondary | ICD-10-CM | POA: Diagnosis present

## 2014-11-02 DIAGNOSIS — J069 Acute upper respiratory infection, unspecified: Secondary | ICD-10-CM | POA: Diagnosis not present

## 2014-11-02 DIAGNOSIS — R05 Cough: Secondary | ICD-10-CM

## 2014-11-02 DIAGNOSIS — F419 Anxiety disorder, unspecified: Secondary | ICD-10-CM | POA: Diagnosis not present

## 2014-11-02 DIAGNOSIS — Z792 Long term (current) use of antibiotics: Secondary | ICD-10-CM | POA: Diagnosis not present

## 2014-11-02 DIAGNOSIS — Z3202 Encounter for pregnancy test, result negative: Secondary | ICD-10-CM | POA: Insufficient documentation

## 2014-11-02 DIAGNOSIS — Z8679 Personal history of other diseases of the circulatory system: Secondary | ICD-10-CM | POA: Insufficient documentation

## 2014-11-02 DIAGNOSIS — J00 Acute nasopharyngitis [common cold]: Secondary | ICD-10-CM

## 2014-11-02 DIAGNOSIS — F329 Major depressive disorder, single episode, unspecified: Secondary | ICD-10-CM | POA: Insufficient documentation

## 2014-11-02 DIAGNOSIS — R059 Cough, unspecified: Secondary | ICD-10-CM

## 2014-11-02 DIAGNOSIS — J208 Acute bronchitis due to other specified organisms: Secondary | ICD-10-CM

## 2014-11-02 LAB — CBC WITH DIFFERENTIAL/PLATELET
BASOS ABS: 0 10*3/uL (ref 0.0–0.1)
BASOS PCT: 0 % (ref 0–1)
EOS ABS: 0.1 10*3/uL (ref 0.0–0.7)
EOS PCT: 1 % (ref 0–5)
HCT: 41.9 % (ref 36.0–46.0)
Hemoglobin: 14.5 g/dL (ref 12.0–15.0)
Lymphocytes Relative: 31 % (ref 12–46)
Lymphs Abs: 2.8 10*3/uL (ref 0.7–4.0)
MCH: 30.1 pg (ref 26.0–34.0)
MCHC: 34.6 g/dL (ref 30.0–36.0)
MCV: 87.1 fL (ref 78.0–100.0)
Monocytes Absolute: 0.8 10*3/uL (ref 0.1–1.0)
Monocytes Relative: 9 % (ref 3–12)
Neutro Abs: 5.2 10*3/uL (ref 1.7–7.7)
Neutrophils Relative %: 59 % (ref 43–77)
PLATELETS: 280 10*3/uL (ref 150–400)
RBC: 4.81 MIL/uL (ref 3.87–5.11)
RDW: 12.4 % (ref 11.5–15.5)
WBC: 8.9 10*3/uL (ref 4.0–10.5)

## 2014-11-02 LAB — BASIC METABOLIC PANEL
Anion gap: 10 (ref 5–15)
BUN: 13 mg/dL (ref 6–23)
CALCIUM: 9.6 mg/dL (ref 8.4–10.5)
CO2: 27 mmol/L (ref 19–32)
Chloride: 102 mEq/L (ref 96–112)
Creatinine, Ser: 0.6 mg/dL (ref 0.50–1.10)
Glucose, Bld: 102 mg/dL — ABNORMAL HIGH (ref 70–99)
Potassium: 3.7 mmol/L (ref 3.5–5.1)
Sodium: 139 mmol/L (ref 135–145)

## 2014-11-02 LAB — PREGNANCY, URINE: Preg Test, Ur: NEGATIVE

## 2014-11-02 LAB — TROPONIN I

## 2014-11-02 MED ORDER — HYDROCODONE-HOMATROPINE 5-1.5 MG/5ML PO SYRP: 5.0000 mL | ORAL_SOLUTION | Freq: Three times a day (TID) | ORAL | Status: DC | PRN

## 2014-11-02 NOTE — ED Notes (Signed)
Pt reports having a cough and cold and has taken antbx  Was seen by PCP today and sent down for chest discomfort and difficulty taking a deep breath.

## 2014-11-02 NOTE — Assessment & Plan Note (Signed)
For you upper respiratory/bronchtis type symptoms continue with new rx doxycycline(if ED agrees after cxr). I will rx hydromet for the cough.

## 2014-11-02 NOTE — ED Provider Notes (Signed)
CSN: [id number]     Arrival date & time 11/02/14  1219 History   First MD Initiated Contact with Patient 11/02/14 1227     Chief Complaint  Patient presents with  . Chest Pain     (Consider location/radiation/quality/duration/timing/severity/associated sxs/prior Treatment) Patient is a 38 y.o. female presenting with chest pain. The history is provided by the patient.  Chest Pain Pain location:  L chest Pain quality: pressure   Pain radiates to:  Does not radiate Pain radiates to the back: no   Pain severity:  Mild Onset quality:  Gradual Duration:  3 days Timing:  Constant Progression:  Unchanged Chronicity:  New Context: at rest   Relieved by:  Nothing Worsened by:  Coughing Ineffective treatments:  None tried Associated symptoms: cough and shortness of breath   Associated symptoms: no abdominal pain, no back pain, no dizziness, no fatigue, no fever, no headache, no nausea and not vomiting     Past Medical History  Diagnosis Date  . History of pneumonia   . Nonspecific reaction to tuberculin skin test without active tuberculosis   . Hypercholesterolemia   . Borderline diabetes   . GERD (gastroesophageal reflux disease)   . Constipation   . Lumbago   . History of depression   . Depression   . Migraine   . Diabetes mellitus     borderline, no med  . Anxiety    Past Surgical History  Procedure Laterality Date  . Breast reduction surgery  1995  . Appendectomy  age 35  . Tonsillectomy  age 25   Family History  Problem Relation Age of Onset  . Emphysema Maternal Grandfather   . COPD Maternal Grandfather   . Heart disease Maternal Grandfather   . Allergies Sister   . Asthma Sister   . Seizures Sister   . Heart disease Father   . Diabetes Father   . Heart disease Maternal Grandmother   . Clotting disorder Maternal Grandmother   . Rheum arthritis Maternal Grandmother   . Breast cancer Maternal Aunt   . Cancer Maternal Aunt   . Arthritis Mother   .  Hyperlipidemia Mother   . Hypertension Mother   . Mental illness Mother   . Heart disease Paternal Grandmother   . Cancer Paternal Grandfather     prostate  . Heart disease Paternal Grandfather    History  Substance Use Topics  . Smoking status: Never Smoker   . Smokeless tobacco: Never Used  . Alcohol Use: Yes     Comment: occasional   OB History    Gravida Para Term Preterm AB TAB SAB Ectopic Multiple Living   4 2 2  2  2   2      Review of Systems  Constitutional: Negative for fever and fatigue.  HENT: Positive for congestion. Negative for drooling.   Eyes: Negative for pain.  Respiratory: Positive for cough and shortness of breath.   Cardiovascular: Negative for chest pain.       Chest pressure  Gastrointestinal: Negative for nausea, vomiting, abdominal pain and diarrhea.  Genitourinary: Negative for dysuria and hematuria.  Musculoskeletal: Negative for back pain, gait problem and neck pain.  Skin: Negative for color change.  Neurological: Negative for dizziness and headaches.  Hematological: Negative for adenopathy.  Psychiatric/Behavioral: Negative for behavioral problems.  All other systems reviewed and are negative.     Allergies  Sulfonamide derivatives  Home Medications   Prior to Admission medications   Medication Sig Start Date End  Date Taking? Authorizing Provider  ALPRAZolam Prudy Feeler(XANAX) 0.5 MG tablet TAKE 1 TABLET BY MOUTH TWICE DAILY 10/23/14   Waldon MerlWilliam C Martin, PA-C  benzonatate (TESSALON) 100 MG capsule Take 1 capsule (100 mg total) by mouth 2 (two) times daily as needed for cough. 11/01/14   Junie Spencerhristy A Hawks, FNP  doxycycline (VIBRA-TABS) 100 MG tablet Take 1 tablet (100 mg total) by mouth 2 (two) times daily. 11/01/14   Junie Spencerhristy A Hawks, FNP  HYDROcodone-homatropine (HYCODAN) 5-1.5 MG/5ML syrup Take 5 mLs by mouth every 8 (eight) hours as needed for cough. 11/02/14   Bayard BeaverEdward M Saguier, PA-C  hydrocortisone-pramoxine (ANALPRAM HC) 2.5-1 % rectal cream Place  1 application rectally 3 (three) times daily. 10/05/14   Waldon MerlWilliam C Martin, PA-C  sertraline (ZOLOFT) 50 MG tablet Take 1 tablet (50 mg total) by mouth daily. 07/27/14   Sheliah HatchKatherine E Tabori, MD  valACYclovir (VALTREX) 1000 MG tablet Take 1 tablet (1,000 mg total) by mouth 3 (three) times daily. 10/05/14   Waldon MerlWilliam C Martin, PA-C   BP 135/83 mmHg  Pulse 74  Temp(Src) 99.4 F (37.4 C) (Oral)  Resp 18  SpO2 98%  LMP 10/14/2014 Physical Exam  Constitutional: She is oriented to person, place, and time. She appears well-developed and well-nourished.  HENT:  Head: Normocephalic.  Mouth/Throat: Oropharynx is clear and moist. No oropharyngeal exudate.  Eyes: Conjunctivae and EOM are normal. Pupils are equal, round, and reactive to light.  Neck: Normal range of motion. Neck supple.  Cardiovascular: Normal rate, regular rhythm, normal heart sounds and intact distal pulses.  Exam reveals no gallop and no friction rub.   No murmur heard. Pulmonary/Chest: Effort normal and breath sounds normal. No respiratory distress. She has no wheezes.  Abdominal: Soft. Bowel sounds are normal. There is no tenderness. There is no rebound and no guarding.  Musculoskeletal: Normal range of motion. She exhibits no edema or tenderness.  Normal-appearing symmetric lower extremities.  Neurological: She is alert and oriented to person, place, and time.  Skin: Skin is warm and dry.  Psychiatric: She has a normal mood and affect. Her behavior is normal.  Nursing note and vitals reviewed.   ED Course  Procedures (including critical care time) Labs Review Labs Reviewed  BASIC METABOLIC PANEL - Abnormal; Notable for the following:    Glucose, Bld 102 (*)    All other components within normal limits  CBC WITH DIFFERENTIAL  TROPONIN I  PREGNANCY, URINE    Imaging Review Dg Chest 2 View  11/02/2014   CLINICAL DATA:  Cough.  Initial encounter.  Symptoms for 1 month.  EXAM: CHEST  2 VIEW  COMPARISON:  12/12/2010.   FINDINGS: Cardiopericardial silhouette within normal limits. Mediastinal contours normal. Trachea midline. No airspace disease or effusion.  IMPRESSION: No active cardiopulmonary disease.   Electronically Signed   By: Andreas NewportGeoffrey  Lamke M.D.   On: 11/02/2014 12:53     EKG Interpretation   Date/Time:  Monday November 02 2014 12:23:47 EST Ventricular Rate:  90 PR Interval:  154 QRS Duration: 80 QT Interval:  362 QTC Calculation: 442 R Axis:   83 Text Interpretation:  Normal sinus rhythm no previous for comparison  Confirmed by Terel Bann  MD, Jodi Criscuolo ([id number]) on 11/02/2014 1:39:21 PM      MDM   Final diagnoses:  Viral URI  Atypical chest pain    12:38 PM 38 y.o. female who presents with chest pain and shortness of breath. She states that she has had cough and nasal congestion for approximately  one month. She denies any fevers. She states that she developed a mild pressure on her left chest and mild shortness of breath over the last 2-3 days. She notes it is constant and seems to be worse with coughing.she denies any pleuritic pain. Wells/perc neg (has mirena). Atypical for cardiac given concurrent URI sx and worse w/ coughing. RF's include HLP, FH of heart dis. The patient notes that she has been on cough syrup, Z-Pak, and Augmentin without relief. Vital signs unremarkable here. We'll get screening labs and imaging.  1:58 PM: Pt cont to appear well. I interpreted/reviewed the labs and/or imaging which were non-contributory.  Low risk for MACE per HEART score. Sx likely related to URI.  I have discussed the diagnosis/risks/treatment options with the patient and believe the pt to be eligible for discharge home to follow-up with her pcp. We also discussed returning to the ED immediately if new or worsening sx occur. We discussed the sx which are most concerning (e.g., worsening cp, sob, fever) that necessitate immediate return. Medications administered to the patient during their visit and any new  prescriptions provided to the patient are listed below.  Medications given during this visit Medications - No data to display  New Prescriptions   No medications on file     Purvis SheffieldForrest Emme Rosenau, MD 11/02/14 1359

## 2014-11-02 NOTE — ED Notes (Signed)
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

## 2014-11-02 NOTE — Patient Instructions (Addendum)
For you upper respiratory/bronchtis type symptoms continue with new rx doxycycline(if ED agrees after cxr). I will rx hydromet for the cough.  However for the chest pressure for 2 days, I want you to go to ED. This pressure is not consistent with costochondritis and with your dad family history I thinks the safest course would ED eval. Set of enzymes would be helpful.  Follow up 7-10 days or as needed.  I talked with charge nurse and notified pt on her way down. Pt agreed to go down and she is aware where ED location.

## 2014-11-02 NOTE — Assessment & Plan Note (Signed)
However for the chest pressure for 2 days, I want you to go to ED. This pressure is not consistent with costochondritis and with your dad family history I thinks the safest course would ED eval. Set of enzymes would be helpful.

## 2014-11-02 NOTE — Progress Notes (Signed)
   Subjective:    Patient ID: Jody [name], female    DOB: May 27, 1976, 38 y.o.   MRN: [id number]  HPI  Pt in today reporting cough, nasal congestion and runny nose for  1 month.  Pt has been through a zpack, augmentin, and now doxycycline.(just got this yesterday e-visit)  Pt states she has some left sided chest pain on and off x 2 days. Pt now  Some constant  discomfort /pressure now. Not on coughing. No wheezing or sob  E-visit yesterday gave her an antibiotic. Mild pain breathing but reports heaviness/pressure mostly. Though not severe pressure. No jaw pain or arm pain  Associated symptoms( below yes or no)  Fever- yes, nothing over 100. Chills-no. Little clammy. Chest congestion- yes. Sneezing- no Itching eyes-no Sore throat- no Post-nasal drainage-yes Wheezing-No Purulent drainage-no Fatigue-no  Pt has no history of hyperlipidemia. Pt borderline diabetes. Parents have hyperlipidemia, Pt not smoker. She is overweight. Pt dad has history of heart attacks(Age 40 yo)  Pt has some mild pressure chest for 2 days but not reproducible and not on cough or deep inspiration.        Review of Systems See above.    Objective:   Physical Exam   General  Mental Status - Alert. General Appearance - Well groomed. Not in acute distress.  Skin Rashes- No Rashes.  HEENT Head- Normal. Ear Auditory Canal - Left- Normal. Right - Normal.Tympanic Membrane- Left- Normal. Right- Normal. Eye Sclera/Conjunctiva- Left- Normal. Right- Normal. Nose & Sinuses Nasal Mucosa- Left-  Not boggy or Congested. Right-  Not  boggy or Congested. Mouth & Throat Lips: Upper Lip- Normal: no dryness, cracking, pallor, cyanosis, or vesicular eruption. Lower Lip-Normal: no dryness, cracking, pallor, cyanosis or vesicular eruption. Buccal Mucosa- Bilateral- No Aphthous ulcers. Oropharynx- No Discharge or Erythema. Tonsils: Characteristics- Bilateral- No Erythema or Congestion. Size/Enlargement-  Bilateral- No enlargement. Discharge- bilateral-None.  Neck Neck- Supple. No Masses.   Chest and Lung Exam Auscultation: Breath Sounds:- even and unlabored.  Cardiovascular Auscultation:Rythm- Regular, rate and rhythm. Murmurs & Other Heart Sounds:Ausculatation of the heart reveal- No Murmurs.  Lymphatic Head & Neck General Head & Neck Lymphatics: Bilateral: Description- No Localized lymphadenopathy.  Anterior thorax- on deep inpiration, cough or chest palpation. No reproducible pain         Assessment & Plan:

## 2014-11-02 NOTE — Progress Notes (Signed)
Pre visit review using our clinic review tool, if applicable. No additional management support is needed unless otherwise documented below in the visit note. 

## 2014-11-02 NOTE — Discharge Instructions (Signed)

## 2014-11-13 ENCOUNTER — Encounter: Payer: Self-pay | Admitting: Family Medicine

## 2014-11-13 ENCOUNTER — Ambulatory Visit (INDEPENDENT_AMBULATORY_CARE_PROVIDER_SITE_OTHER): Payer: 59 | Admitting: Family Medicine

## 2014-11-13 VITALS — BP 120/80 | HR 84 | Temp 98.2°F | Resp 16 | Wt 209.1 lb

## 2014-11-13 DIAGNOSIS — G43009 Migraine without aura, not intractable, without status migrainosus: Secondary | ICD-10-CM

## 2014-11-13 DIAGNOSIS — F411 Generalized anxiety disorder: Secondary | ICD-10-CM

## 2014-11-13 MED ORDER — ONDANSETRON 4 MG PO TBDP: 4.0000 mg | ORAL_TABLET | Freq: Three times a day (TID) | ORAL | Status: AC | PRN

## 2014-11-13 MED ORDER — FLUOXETINE HCL 20 MG PO TABS: 20.0000 mg | ORAL_TABLET | Freq: Every day | ORAL | Status: DC

## 2014-11-13 MED ORDER — SUMATRIPTAN SUCCINATE 100 MG PO TABS: 100.0000 mg | ORAL_TABLET | Freq: Once | ORAL | Status: AC

## 2014-11-13 MED ORDER — ESZOPICLONE 2 MG PO TABS: 2.0000 mg | ORAL_TABLET | Freq: Every evening | ORAL | Status: DC | PRN

## 2014-11-13 NOTE — Progress Notes (Signed)
Pre visit review using our clinic review tool, if applicable. No additional management support is needed unless otherwise documented below in the visit note. 

## 2014-11-13 NOTE — Patient Instructions (Signed)
Schedule your complete physical at your convenience STOP the Zoloft START the Prozac 20mg  daily- if mood is not well controlled after 1 month, please call so we can make adjustments STOP the Trazodone Start the [place] for sleep Use the Imitrex and Zofran as needed for migraines Call with any questions or concerns Happy New Year!!!

## 2014-11-13 NOTE — Progress Notes (Signed)
   Subjective:    Patient ID: Jody [name] [name], female    DOB: 06/10/1976, 39 y.o.   MRN: [id number]  HPI Depression/anxiety- pt reports mood is better on Zoloft.  'no sex drive'.  Willing to change meds.  Pt reports she is still not able to sleep.  No relief w/ Trazodone or Xanax.  Is now taking 2 xanax and OTC sleep aides.  Pt going to bed at 9:30 and will wake up between 11-12 and is unable to go back to sleep.  Has be on Ambien and 'done crazy things on the CR' and Restoril previously.  Migraines- chronic problem.  Pt has had 2 episodes recently.  Is out of Imitrex and Zofran.  Needs refill.  Review of Systems For ROS see HPI     Objective:   Physical Exam  Constitutional: She is oriented to person, place, and time. She appears well-developed and well-nourished. No distress.  HENT:  Head: Normocephalic and atraumatic.  Eyes: EOM are normal. Pupils are equal, round, and reactive to light.  Neck: Normal range of motion. Neck supple. No thyromegaly present.  Cardiovascular: Normal rate, regular rhythm, normal heart sounds and intact distal pulses.   Pulmonary/Chest: Effort normal and breath sounds normal. No respiratory distress. She has no wheezes. She has no rales.  Lymphadenopathy:    She has no cervical adenopathy.  Neurological: She is alert and oriented to person, place, and time. No cranial nerve deficit. Coordination normal.  Skin: Skin is warm and dry.  Psychiatric: She has a normal mood and affect. Her behavior is normal. Thought content normal.  Vitals reviewed.         Assessment & Plan:

## 2014-11-15 NOTE — Assessment & Plan Note (Signed)
Chronic problem for pt.  Needs FMLA updated due to new year.  Will refill Imitrex and Zofran for as needed use.

## 2014-11-15 NOTE — Assessment & Plan Note (Signed)
Pt reports complete lack of sex drive on Zoloft.  Will switch to Prozac to address side effects.  Continue Xanax prn.  Reviewed supportive care and red flags that should prompt return.  Pt expressed understanding and is in agreement w/ plan.

## 2014-11-24 ENCOUNTER — Telehealth: Payer: Self-pay | Admitting: *Deleted

## 2014-11-24 DIAGNOSIS — Z7689 Persons encountering health services in other specified circumstances: Secondary | ICD-10-CM

## 2014-11-24 NOTE — Telephone Encounter (Signed)
Received FMLA paperwork via fax from [employer] for migraines. Paperwork filled out and forwarded to Dr. Beverely Lowabori. JG//CMA

## 2014-11-24 NOTE — Telephone Encounter (Signed)
Completed paperwork faxed to [employer] at [phone] successfully. JG//CMA

## 2014-12-02 ENCOUNTER — Telehealth: Payer: Self-pay | Admitting: Family

## 2014-12-02 DIAGNOSIS — R059 Cough, unspecified: Secondary | ICD-10-CM

## 2014-12-02 DIAGNOSIS — J069 Acute upper respiratory infection, unspecified: Secondary | ICD-10-CM

## 2014-12-02 DIAGNOSIS — R05 Cough: Secondary | ICD-10-CM

## 2014-12-02 MED ORDER — BENZONATATE 200 MG PO CAPS: 200.0000 mg | ORAL_CAPSULE | Freq: Three times a day (TID) | ORAL | Status: DC | PRN

## 2014-12-02 MED ORDER — AZITHROMYCIN 250 MG PO TABS: ORAL_TABLET | ORAL | Status: DC

## 2014-12-02 NOTE — Progress Notes (Signed)
We are sorry that you are not feeling well.  Here is how we plan to help!  Based on what you have shared with me it looks like you have upper respiratory tract inflammation that has resulted in a signification cough.  Inflammation and infection in the upper respiratory tract is commonly called bronchitis and has four common causes:  Allergies, Viral Infections, Acid Reflux and Bacterial Infections.  Allergies, viruses and acid reflux are treated by controlling symptoms or eliminating the cause. An example might be a cough caused by taking certain blood pressure medications. You stop the cough by changing the medication. Another example might be a cough caused by acid reflux. Controlling the reflux helps control the cough.  Based on your presentation I believe you most likely have A cough due to bacteria.  When patients have a fever and a productive cough with a change in color or increased sputum production, we are concerned about bacterial bronchitis.  If left untreated it can progress to pneumonia.  If your symptoms do not improve with your treatment plan it is important that you contact your provider.   I have prescribed Azithromyin 250 mg: two tables now and then one tablet daily for 4 additonal days   In addition you may use A non-prescription cough medication called Robitussin DAC. Take 2 teaspoons every 8 hours or Delsym: take 2 teaspoons every 12 hours., A non-prescription cough medication called Mucinex DM: take 2 tablets every 12 hours. and A prescription cough medication called Tessalon Perles 100mg. You may take 1-2 capsules every 8 hours as needed for your cough.    HOME CARE . Only take medications as instructed by your medical team. . Complete the entire course of an antibiotic. . Drink plenty of fluids and get plenty of rest. . Avoid close contacts especially the very young and the elderly . Cover your mouth if you cough or cough into your sleeve. . Always remember to wash your  hands . A steam or ultrasonic humidifier can help congestion.    GET HELP RIGHT AWAY IF: . You develop worsening fever. . You become short of breath . You cough up blood. . Your symptoms persist after you have completed your treatment plan MAKE SURE YOU   Understand these instructions.  Will watch your condition.  Will get help right away if you are not doing well or get worse.  Your e-visit answers were reviewed by a board certified advanced clinical practitioner to complete your personal care plan.  Depending on the condition, your plan could have included both over the counter or prescription medications.  If there is a problem please reply  once you have received a response from your provider.  Your safety is important to us.  If you have drug allergies check your prescription carefully.    You can use MyChart to ask questions about today's visit, request a non-urgent call back, or ask for a work or school excuse.  You will get an e-mail in the next two days asking about your experience.  I hope that your e-visit has been valuable and will speed your recovery. Thank you for using e-visits.   

## 2014-12-23 ENCOUNTER — Ambulatory Visit (INDEPENDENT_AMBULATORY_CARE_PROVIDER_SITE_OTHER): Payer: 59 | Admitting: Family Medicine

## 2014-12-23 ENCOUNTER — Encounter: Payer: Self-pay | Admitting: Family Medicine

## 2014-12-23 VITALS — BP 132/86 | HR 84 | Temp 98.6°F | Resp 16 | Wt 204.5 lb

## 2014-12-23 DIAGNOSIS — G43009 Migraine without aura, not intractable, without status migrainosus: Secondary | ICD-10-CM

## 2014-12-23 DIAGNOSIS — J0100 Acute maxillary sinusitis, unspecified: Secondary | ICD-10-CM

## 2014-12-23 MED ORDER — AMOXICILLIN 875 MG PO TABS: 875.0000 mg | ORAL_TABLET | Freq: Two times a day (BID) | ORAL | Status: DC

## 2014-12-23 MED ORDER — PROMETHAZINE-DM 6.25-15 MG/5ML PO SYRP: 5.0000 mL | ORAL_SOLUTION | Freq: Four times a day (QID) | ORAL | Status: DC | PRN

## 2014-12-23 NOTE — Assessment & Plan Note (Signed)
Recurrent problem for pt.  Encouraged her to continue Imitrex prn.  Increase hydration.  Resolution of sinusitis should improve migraine sxs.  Reviewed supportive care and red flags that should prompt return.  Pt expressed understanding and is in agreement w/ plan.

## 2014-12-23 NOTE — Assessment & Plan Note (Signed)
Pt's sxs and PE consistent w/ infxn.  Suspect this is the cause of her migraine.  Start abx.  Cough meds prn.  Reviewed supportive care and red flags that should prompt return.  Pt expressed understanding and is in agreement w/ plan.

## 2014-12-23 NOTE — Patient Instructions (Signed)
Follow up as needed Start Amox- twice daily- take w/ food Drink plenty of fluids REST! Use the cough syrup as needed for nights/weekends- will cause drowsiness Mucinex DM for daytime cough Continue to treat the migraines as needed Call with any questions or concerns Hang in there!!!

## 2014-12-23 NOTE — Progress Notes (Signed)
   Subjective:    Patient ID: Jody [name], female    DOB: 1976/09/18, 39 y.o.   MRN: [id number]  HPI URI- cough started 'a couple days' ago.  Last night developed migraine in association w/ nasal congestion, sinus pressure.  This is 2nd migraine in 2 weeks.  + tooth pain.  No ear pain, + sore throat.  + nausea.  No vomiting.  Took Imitrex and Zofran w/ minimal.   Review of Systems For ROS see HPI   Reviewed meds, allergies, problem list, and PMH in chart     Objective:   Physical Exam  Constitutional: She is oriented to person, place, and time. She appears well-developed and well-nourished. No distress.  HENT:  Head: Normocephalic and atraumatic.  Right Ear: Tympanic membrane normal.  Left Ear: Tympanic membrane normal.  Nose: Mucosal edema and rhinorrhea present. Right sinus exhibits maxillary sinus tenderness and frontal sinus tenderness. Left sinus exhibits maxillary sinus tenderness and frontal sinus tenderness.  Mouth/Throat: Uvula is midline and mucous membranes are normal. Posterior oropharyngeal erythema present. No oropharyngeal exudate.  Eyes: Conjunctivae and EOM are normal. Pupils are equal, round, and reactive to light.  Neck: Normal range of motion. Neck supple.  Cardiovascular: Normal rate, regular rhythm and normal heart sounds.   Pulmonary/Chest: Effort normal and breath sounds normal. No respiratory distress. She has no wheezes.  Lymphadenopathy:    She has no cervical adenopathy.  Neurological: She is alert and oriented to person, place, and time.  Skin: Skin is warm and dry.  Psychiatric: She has a normal mood and affect. Her behavior is normal. Thought content normal.  Vitals reviewed.         Assessment & Plan:

## 2014-12-23 NOTE — Progress Notes (Signed)
Pre visit review using our clinic review tool, if applicable. No additional management support is needed unless otherwise documented below in the visit note. 

## 2014-12-28 ENCOUNTER — Encounter: Payer: Self-pay | Admitting: Family Medicine

## 2014-12-28 MED ORDER — FLUCONAZOLE 150 MG PO TABS: 150.0000 mg | ORAL_TABLET | Freq: Once | ORAL | Status: DC

## 2014-12-28 MED ORDER — ESZOPICLONE 3 MG PO TABS: 3.0000 mg | ORAL_TABLET | Freq: Every day | ORAL | Status: DC

## 2015-01-05 ENCOUNTER — Telehealth: Payer: Self-pay | Admitting: Nurse Practitioner

## 2015-01-05 DIAGNOSIS — J029 Acute pharyngitis, unspecified: Secondary | ICD-10-CM

## 2015-01-05 NOTE — Progress Notes (Signed)
Based on what you shared with me it looks like you have a serious condition that should be evaluated in a face to face office visit.  According to chart you just finished amoxicillin- your sor ethroat is probably viral since you do not have a fever. If continues need to see your PCP.  If you are having a true medical emergency please call 911.  If you need an urgent face to face visit, [facility] has four urgent care centers for your convenience.  Jody [name]. [facility]  [phone] Get Driving Directions Find a Provider at this Location  [address] [place], [place] [address] . 8 am to 8 pm Monday-Friday . 9 am to 7 pm Saturday-Sunday  . [facility]  [phone] Get Driving Directions Find a Provider at this Location  [address] [place], [place] [address] . 8 am to 8 pm Monday-Friday . 9 am to 6 pm Saturday . 11 am to 6 pm Sunday   . [facility] [place]  [phone] Get Driving Directions  [address] [place], [place] [address] . 8 am to 8 pm Monday-Friday . 9 am to 4 pm Saturday-Sunday   . [facility] (a walk in primary care provider)  [phone]  Get Driving Directions Find a Provider at this Location  [address] [place], [place] [address] . 8 am to 8:30 pm Monday-Thursday . 8 am to 6 pm Friday . 8 am to 4 pm Saturday-Sunday   Your e-visit answers were reviewed by a board certified advanced clinical practitioner to complete your personal care plan.  Depending on the condition, your plan could have included both over the counter or prescription medications.  You will get an e-mail in the next two days asking about your experience.  I hope that your e-visit has been valuable and will speed your recovery . Thank you for choosing an e-visit.

## 2015-02-08 IMAGING — CR DG CHEST 2V
2 series · 2 of 2 positions shown · non-contrast
Comparison: 12/12/2010.

CLINICAL DATA: Cough.  Initial encounter.  Symptoms for 1 month.

EXAM:
CHEST  2 VIEW

[w chest pa]
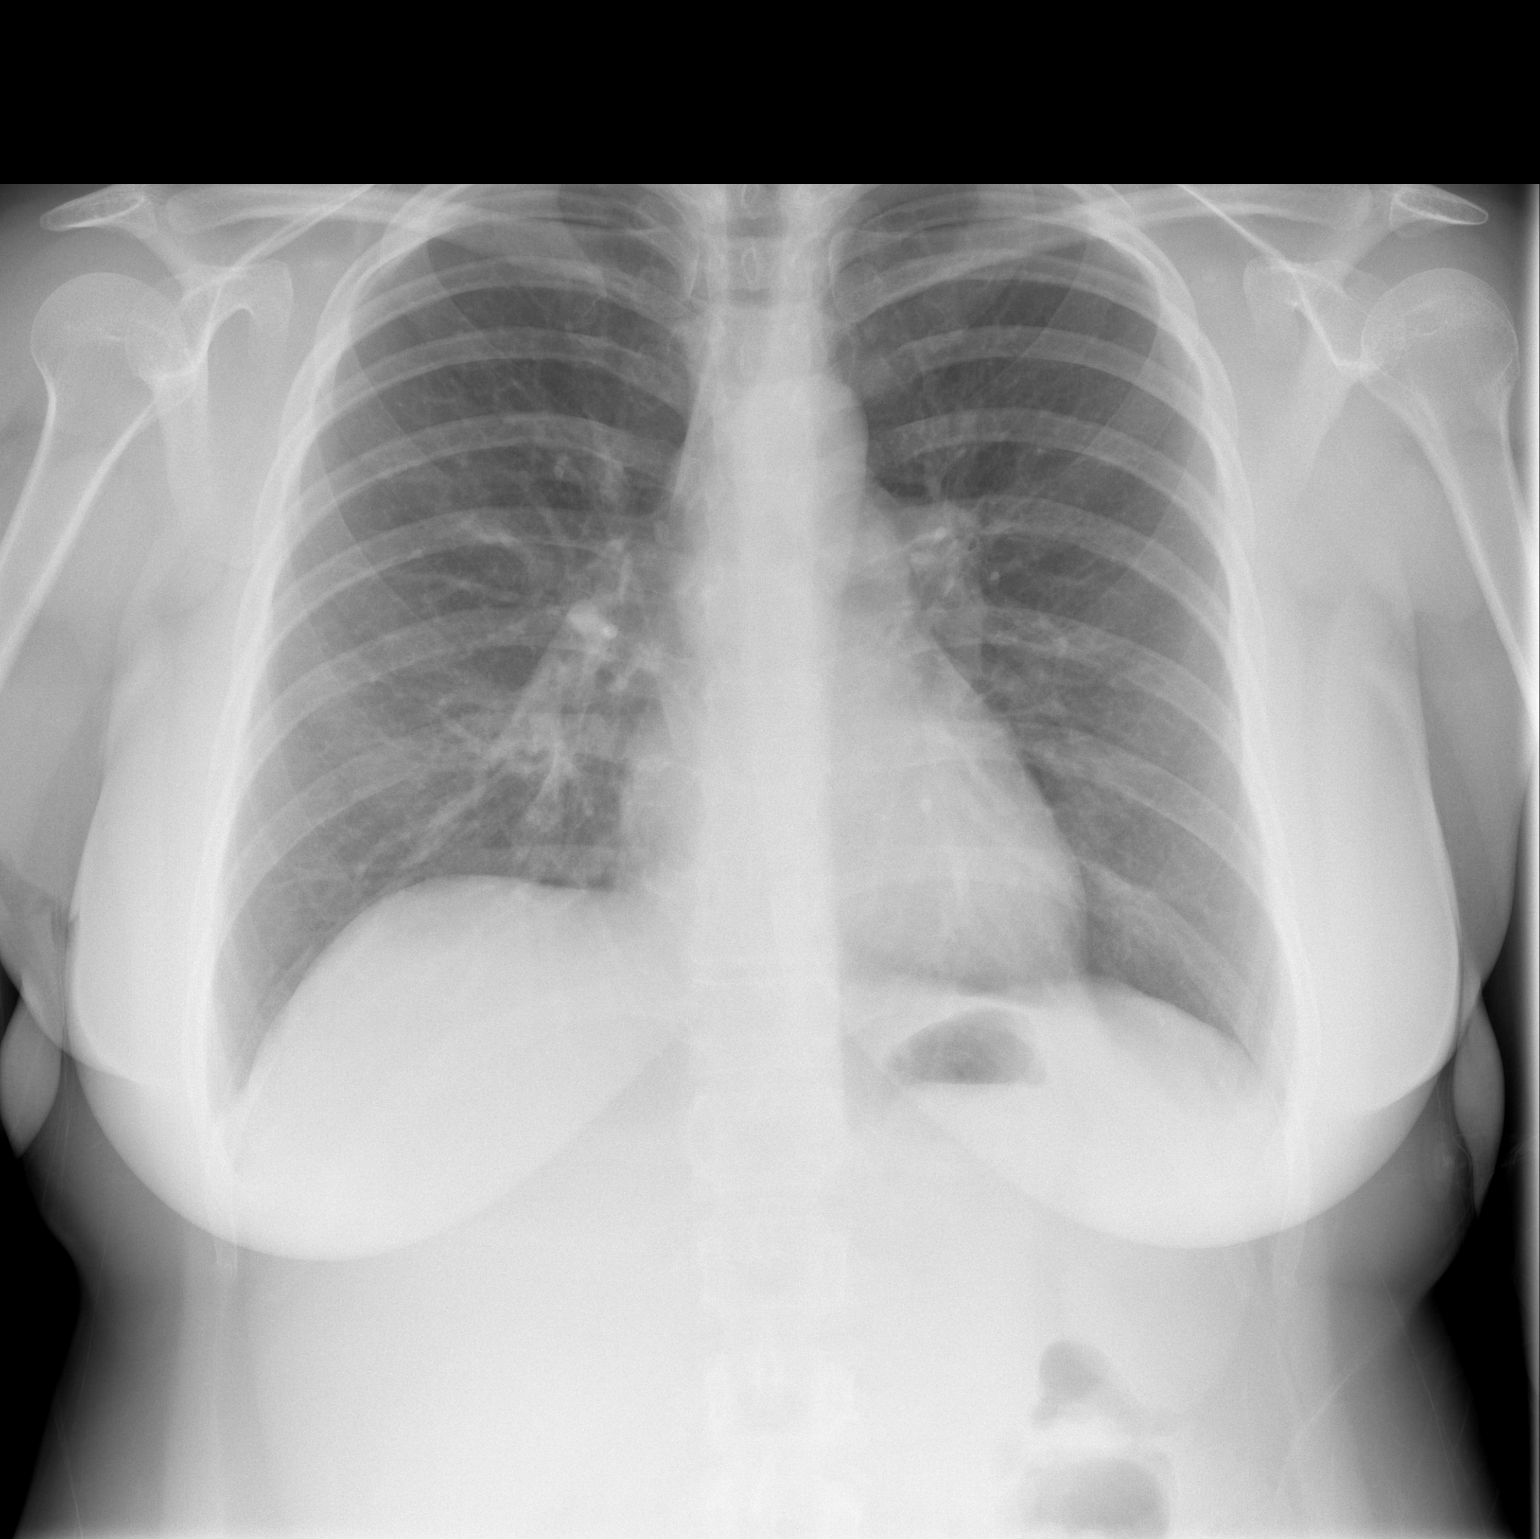

[w chest lat]
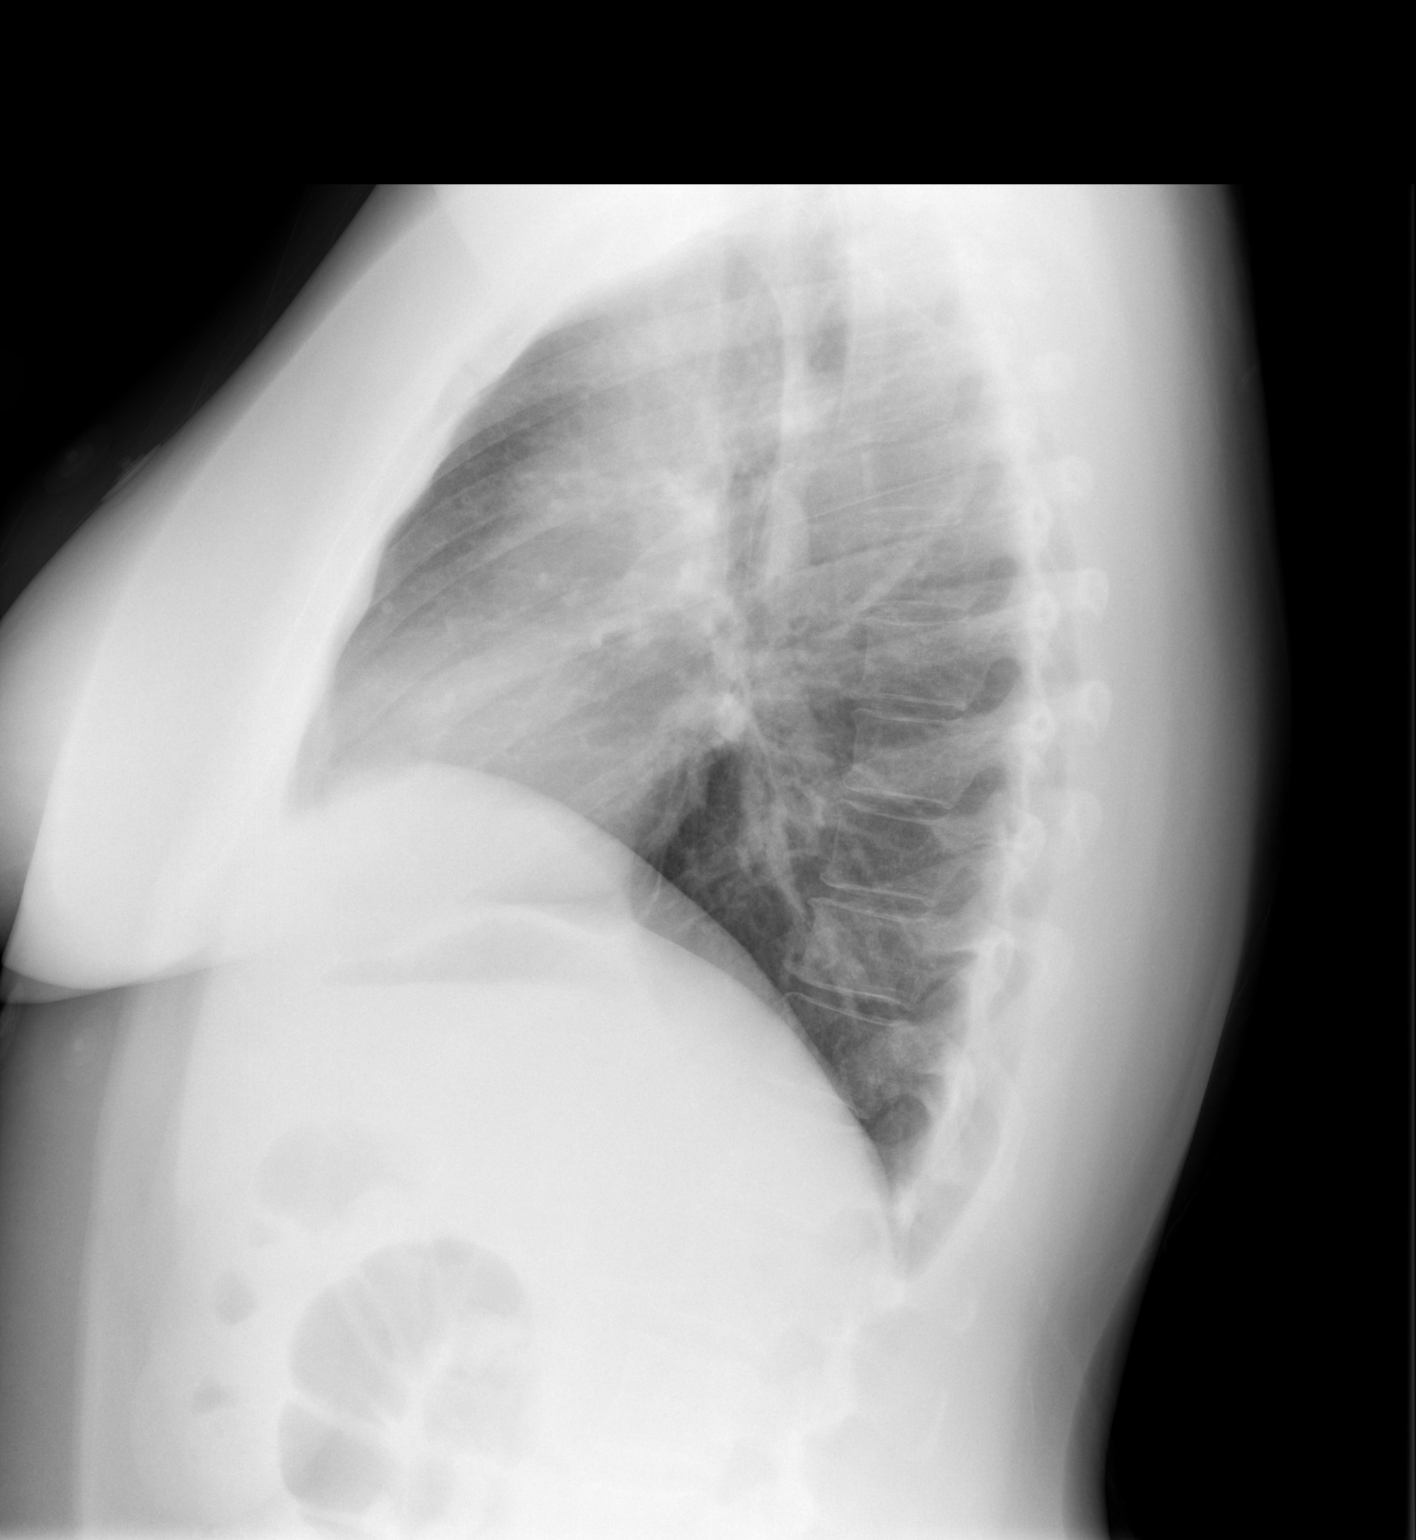

[2 of 2 positions shown; findings below may reference images not displayed]

FINDINGS: Cardiopericardial silhouette within normal limits. Mediastinal
contours normal. Trachea midline. No airspace disease or effusion.
IMPRESSION: No active cardiopulmonary disease.

## 2015-02-23 ENCOUNTER — Telehealth: Payer: Self-pay | Admitting: Family Medicine

## 2015-02-23 NOTE — Telephone Encounter (Signed)
Pre Visit letter sent  °

## 2015-03-01 ENCOUNTER — Other Ambulatory Visit: Payer: Self-pay | Admitting: Family Medicine

## 2015-03-01 NOTE — Telephone Encounter (Signed)
Med filled.  

## 2015-03-02 ENCOUNTER — Other Ambulatory Visit: Payer: Self-pay | Admitting: General Practice

## 2015-03-02 MED ORDER — ALPRAZOLAM 0.5 MG PO TABS: 0.5000 mg | ORAL_TABLET | Freq: Two times a day (BID) | ORAL | Status: DC

## 2015-03-02 NOTE — Telephone Encounter (Signed)
Med filled and faxed.  

## 2015-03-02 NOTE — Telephone Encounter (Signed)
Last OV 12-23-14 Alprazolam last filled 10-23-14 #60 with 0

## 2015-03-04 LAB — HM DIABETES EYE EXAM

## 2015-03-09 ENCOUNTER — Encounter: Payer: Self-pay | Admitting: General Practice

## 2015-03-12 ENCOUNTER — Telehealth: Payer: Self-pay | Admitting: *Deleted

## 2015-03-12 NOTE — Telephone Encounter (Signed)
Unable to reach patient at time of Pre-Visit Call.  Left message for patient to return call when available.    

## 2015-03-15 ENCOUNTER — Encounter: Payer: Self-pay | Admitting: Family Medicine

## 2015-03-15 ENCOUNTER — Encounter: Payer: 59 | Admitting: Family Medicine

## 2015-03-15 ENCOUNTER — Ambulatory Visit (INDEPENDENT_AMBULATORY_CARE_PROVIDER_SITE_OTHER): Payer: 59 | Admitting: Family Medicine

## 2015-03-15 VITALS — BP 122/78 | HR 87 | Temp 98.2°F | Resp 16 | Ht 64.0 in | Wt 190.4 lb

## 2015-03-15 DIAGNOSIS — F411 Generalized anxiety disorder: Secondary | ICD-10-CM | POA: Diagnosis not present

## 2015-03-15 DIAGNOSIS — Z0000 Encounter for general adult medical examination without abnormal findings: Secondary | ICD-10-CM

## 2015-03-15 LAB — BASIC METABOLIC PANEL
BUN: 15 mg/dL (ref 6–23)
CALCIUM: 9.4 mg/dL (ref 8.4–10.5)
CO2: 29 meq/L (ref 19–32)
Chloride: 103 mEq/L (ref 96–112)
Creatinine, Ser: 0.62 mg/dL (ref 0.40–1.20)
GFR: 113.05 mL/min (ref >60.00)
GLUCOSE: 87 mg/dL (ref 70–99)
Potassium: 4 mEq/L (ref 3.5–5.1)
SODIUM: 136 meq/L (ref 135–145)

## 2015-03-15 LAB — VITAMIN D 25 HYDROXY (VIT D DEFICIENCY, FRACTURES): VITD: 35.01 ng/mL (ref 30.00–100.00)

## 2015-03-15 LAB — HEPATIC FUNCTION PANEL
ALBUMIN: 4.5 g/dL (ref 3.5–5.2)
ALT: 21 U/L (ref 0–35)
AST: 19 U/L (ref 0–37)
Alkaline Phosphatase: 52 U/L (ref 39–117)
Bilirubin, Direct: 0.1 mg/dL (ref 0.0–0.3)
TOTAL PROTEIN: 7 g/dL (ref 6.0–8.3)
Total Bilirubin: 0.6 mg/dL (ref 0.2–1.2)

## 2015-03-15 LAB — CBC WITH DIFFERENTIAL/PLATELET
BASOS PCT: 0.6 % (ref 0.0–3.0)
Basophils Absolute: 0 10*3/uL (ref 0.0–0.1)
EOS PCT: 0.4 % (ref 0.0–5.0)
Eosinophils Absolute: 0 10*3/uL (ref 0.0–0.7)
HCT: 41.2 % (ref 36.0–46.0)
Hemoglobin: 14.2 g/dL (ref 12.0–15.0)
LYMPHS ABS: 2.2 10*3/uL (ref 0.7–4.0)
Lymphocytes Relative: 35.6 % (ref 12.0–46.0)
MCHC: 34.4 g/dL (ref 30.0–36.0)
MCV: 85.8 fl (ref 78.0–100.0)
MONO ABS: 0.3 10*3/uL (ref 0.1–1.0)
Monocytes Relative: 4.9 % (ref 3.0–12.0)
NEUTROS PCT: 58.5 % (ref 43.0–77.0)
Neutro Abs: 3.6 10*3/uL (ref 1.4–7.7)
PLATELETS: 237 10*3/uL (ref 150.0–400.0)
RBC: 4.8 Mil/uL (ref 3.87–5.11)
RDW: 12.8 % (ref 11.5–15.5)
WBC: 6.2 10*3/uL (ref 4.0–10.5)

## 2015-03-15 LAB — LIPID PANEL
Cholesterol: 142 mg/dL (ref 0–200)
HDL: 37.8 mg/dL — ABNORMAL LOW (ref >39.00)
LDL Cholesterol: 90 mg/dL (ref 0–99)
NonHDL: 104.2
Total CHOL/HDL Ratio: 4
Triglycerides: 73 mg/dL (ref 0.0–149.0)
VLDL: 14.6 mg/dL (ref 0.0–40.0)

## 2015-03-15 LAB — TSH: TSH: 1.66 u[IU]/mL (ref 0.35–4.50)

## 2015-03-15 MED ORDER — FLUOXETINE HCL 20 MG PO TABS: ORAL_TABLET | ORAL | Status: DC

## 2015-03-15 MED ORDER — ESZOPICLONE 3 MG PO TABS: 3.0000 mg | ORAL_TABLET | Freq: Every day | ORAL | Status: DC

## 2015-03-15 MED ORDER — ALPRAZOLAM 1 MG PO TABS: 1.0000 mg | ORAL_TABLET | Freq: Two times a day (BID) | ORAL | Status: DC | PRN

## 2015-03-15 NOTE — Progress Notes (Signed)
Pre visit review using our clinic review tool, if applicable. No additional management support is needed unless otherwise documented below in the visit note. 

## 2015-03-15 NOTE — Patient Instructions (Signed)
Follow up in 1 year or as needed We'll notify you of your lab results and make any changes if needed Keep up the good work!  You look great! Call with any questions or concerns Happy Belated Mother's Day!!! 

## 2015-03-15 NOTE — Assessment & Plan Note (Signed)
Pt's PE WNL.  UTD on GYN.  Check labs.  Anticipatory guidance provided.  

## 2015-03-15 NOTE — Progress Notes (Signed)
   Subjective:    Patient ID: Jody [name], female    DOB: 1976/07/31, 39 y.o.   MRN: [id number]  HPI CPE- UTD on GYN.  No concerns today.   Review of Systems Patient reports no vision/ hearing changes, adenopathy,fever, weight change,  persistant/recurrent hoarseness , swallowing issues, chest pain, palpitations, edema, persistant/recurrent cough, hemoptysis, dyspnea (rest/exertional/paroxysmal nocturnal), gastrointestinal bleeding (melena, rectal bleeding), abdominal pain, significant heartburn, bowel changes, GU symptoms (dysuria, hematuria, incontinence), Gyn symptoms (abnormal  bleeding, pain),  syncope, focal weakness, memory loss, numbness & tingling, skin/hair/nail changes, abnormal bruising or bleeding.  + anxiety- pt would like to increase the dose of xanax b/c it no longer 'takes the edge off' when she takes it at night     Objective:   Physical Exam General Appearance:    Alert, cooperative, no distress, appears stated age  Head:    Normocephalic, without obvious abnormality, atraumatic  Eyes:    PERRL, conjunctiva/corneas clear, EOM's intact, fundi    benign, both eyes  Ears:    Normal TM's and external ear canals, both ears  Nose:   Nares normal, septum midline, mucosa normal, no drainage    or sinus tenderness  Throat:   Lips, mucosa, and tongue normal; teeth and gums normal  Neck:   Supple, symmetrical, trachea midline, no adenopathy;    Thyroid: no enlargement/tenderness/nodules  Back:     Symmetric, no curvature, ROM normal, no CVA tenderness  Lungs:     Clear to auscultation bilaterally, respirations unlabored  Chest Wall:    No tenderness or deformity   Heart:    Regular rate and rhythm, S1 and S2 normal, no murmur, rub   or gallop  Breast Exam:    Deferred to GYN  Abdomen:     Soft, non-tender, bowel sounds active all four quadrants,    no masses, no organomegaly  Genitalia:    Deferred to GYN  Rectal:    Extremities:   Extremities normal, atraumatic, no  cyanosis or edema  Pulses:   2+ and symmetric all extremities  Skin:   Skin color, texture, turgor normal, no rashes or lesions  Lymph nodes:   Cervical, supraclavicular, and axillary nodes normal  Neurologic:   CNII-XII intact, normal strength, sensation and reflexes    throughout          Assessment & Plan:

## 2015-04-28 ENCOUNTER — Ambulatory Visit (INDEPENDENT_AMBULATORY_CARE_PROVIDER_SITE_OTHER): Payer: 59 | Admitting: Family Medicine

## 2015-04-28 VITALS — BP 130/80 | HR 95 | Temp 98.9°F | Resp 18 | Ht 63.5 in | Wt 192.4 lb

## 2015-04-28 DIAGNOSIS — J0101 Acute recurrent maxillary sinusitis: Secondary | ICD-10-CM | POA: Diagnosis not present

## 2015-04-28 DIAGNOSIS — R05 Cough: Secondary | ICD-10-CM | POA: Diagnosis not present

## 2015-04-28 DIAGNOSIS — R059 Cough, unspecified: Secondary | ICD-10-CM

## 2015-04-28 MED ORDER — HYDROCODONE-HOMATROPINE 5-1.5 MG/5ML PO SYRP: 5.0000 mL | ORAL_SOLUTION | Freq: Every evening | ORAL | Status: DC | PRN

## 2015-04-28 MED ORDER — FLUCONAZOLE 150 MG PO TABS: 150.0000 mg | ORAL_TABLET | Freq: Every day | ORAL | Status: DC

## 2015-04-28 MED ORDER — AMOXICILLIN-POT CLAVULANATE 875-125 MG PO TABS: 1.0000 | ORAL_TABLET | Freq: Two times a day (BID) | ORAL | Status: DC

## 2015-04-30 NOTE — Progress Notes (Signed)
Chief Complaint:  Chief Complaint  Patient presents with  . Cough    C/O cough & congestion x 3 days. Has tried several OTC meds. Losing voice now.    HPI: Jody [name] is a 39 y.o. female who is here for  3 day history of cough and congestion. She is bringing up some productive sputum that is white to green. She has a history of allergies. She has a history of sinus infections. She patient may be improving but she is tried over-the-counter medications and her throat and her voice is still very sore and she is getting laryngitis from the coughing. He has taken [employer] without relief. Denies any fevers or chills.  Past Medical History  Diagnosis Date  . History of pneumonia   . Nonspecific reaction to tuberculin skin test without active tuberculosis   . Hypercholesterolemia   . Borderline diabetes   . GERD (gastroesophageal reflux disease)   . Constipation   . Lumbago   . History of depression   . Depression   . Migraine   . Diabetes mellitus     borderline, no med  . Anxiety    Past Surgical History  Procedure Laterality Date  . Breast reduction surgery  1995  . Appendectomy  age 63  . Tonsillectomy  age 20   History   Social History  . Marital Status: Married    Spouse Name: N/A  . Number of Children: 0  . Years of Education: N/A   Occupational History  . works for [employer]    Social History Main Topics  . Smoking status: Never Smoker   . Smokeless tobacco: Never Used  . Alcohol Use: 0.0 oz/week    0 Standard drinks or equivalent per week     Comment: occasional  . Drug Use: No  . Sexual Activity: Yes   Other Topics Concern  . None   Social History Narrative   Separated, but in a relationship   Family History  Problem Relation Age of Onset  . Emphysema Maternal Grandfather   . COPD Maternal Grandfather   . Heart disease Maternal Grandfather   . Allergies Sister   . Asthma Sister   . Seizures Sister   . Heart disease  Father   . Diabetes Father   . Heart disease Maternal Grandmother   . Clotting disorder Maternal Grandmother   . Rheum arthritis Maternal Grandmother   . Breast cancer Maternal Aunt   . Cancer Maternal Aunt   . Arthritis Mother   . Hyperlipidemia Mother   . Hypertension Mother   . Mental illness Mother   . Heart disease Paternal Grandmother   . Cancer Paternal Grandfather     prostate  . Heart disease Paternal Grandfather    Allergies  Allergen Reactions  . Sulfonamide Derivatives Rash   Prior to Admission medications   Medication Sig Start Date End Date Taking? Authorizing Provider  ALPRAZolam Prudy Feeler) 1 MG tablet Take 1 tablet (1 mg total) by mouth 2 (two) times daily as needed for anxiety. 03/15/15  Yes Sheliah Hatch, MD  Eszopiclone 3 MG TABS Take 1 tablet (3 mg total) by mouth at bedtime. Take immediately before bedtime 03/15/15  Yes Sheliah Hatch, MD  FLUoxetine (PROZAC) 20 MG tablet TAKE 1 TABLET (20 MG TOTAL) BY MOUTH DAILY. 03/15/15  Yes Sheliah Hatch, MD  hydrocortisone-pramoxine [facility]) 2.5-1 % rectal cream Place 1 application rectally 3 (three) times daily. 10/05/14  Yes Waldon Merl, PA-C  ondansetron (ZOFRAN ODT) 4 MG disintegrating tablet Take 1 tablet (4 mg total) by mouth every 8 (eight) hours as needed for nausea. 11/13/14  Yes Sheliah Hatch, MD  pantoprazole (PROTONIX) 40 MG tablet Take 40 mg by mouth daily.   Yes Historical Provider, MD  SUMAtriptan (IMITREX) 100 MG tablet Take 1 tablet (100 mg total) by mouth once. May repeat in 2 hrs if needed 11/13/14  Yes Sheliah Hatch, MD  amoxicillin-clavulanate (AUGMENTIN) 875-125 MG per tablet Take 1 tablet by mouth 2 (two) times daily. 04/28/15   Darnita Woodrum P Chesney Suares, DO  fluconazole (DIFLUCAN) 150 MG tablet Take 1 tablet (150 mg total) by mouth daily. May repeat for yeast infection after 3 days 04/28/15   Gloyd Happ P Sueo Cullen, DO  HYDROcodone-homatropine (HYCODAN) 5-1.5 MG/5ML syrup Take 5 mLs by mouth at bedtime as  needed. 04/28/15   Nikolette Reindl P Cyrilla Durkin, DO     ROS: The patient denies fevers, chills, night sweats, unintentional weight loss, chest pain, palpitations, wheezing, dyspnea on exertion, nausea, vomiting, abdominal pain, dysuria, hematuria, melena, numbness, weakness, or tingling.   All other systems have been reviewed and were otherwise negative with the exception of those mentioned in the HPI and as above.    PHYSICAL EXAM: Filed Vitals:   04/28/15 1931  BP: 130/80  Pulse: 95  Temp: 98.9 F (37.2 C)  Resp: 18   Filed Vitals:   04/28/15 1931  Height: 5' 3.5" (1.613 m)  Weight: 192 lb 6 oz (87.261 kg)   Body mass index is 33.54 kg/(m^2).   General: Alert, no acute distress HEENT:  Normocephalic, atraumatic, oropharynx patent. EOMI, PERRLA Erythematous throat, no exudates, TM normal, + sinus tenderness, + erythematous/boggy nasal mucosa Cardiovascular:  Regular rate and rhythm, no rubs murmurs or gallops.  Radial pulse intact. No pedal edema.  Respiratory: Clear to auscultation bilaterally.  No wheezes, rales, or rhonchi.  No cyanosis, no use of accessory musculature GI: No organomegaly, abdomen is soft and non-tender, positive bowel sounds.  No masses. Skin: No rashes. Neurologic: Facial musculature symmetric. Psychiatric: Patient is appropriate throughout our interaction. Lymphatic: No cervical lymphadenopathy Musculoskeletal: Gait intact.   LABS: Results for orders placed or performed in visit on 03/15/15  Lipid panel  Result Value Ref Range   Cholesterol 142 0 - 200 mg/dL   Triglycerides 16.1 0.0 - 149.0 mg/dL   HDL 09.60 (L) >45.40 mg/dL   VLDL 98.1 0.0 - 19.1 mg/dL   LDL Cholesterol 90 0 - 99 mg/dL   Total CHOL/HDL Ratio 4    NonHDL 104.20   Basic metabolic panel  Result Value Ref Range   Sodium 136 135 - 145 mEq/L   Potassium 4.0 3.5 - 5.1 mEq/L   Chloride 103 96 - 112 mEq/L   CO2 29 19 - 32 mEq/L   Glucose, Bld 87 70 - 99 mg/dL   BUN 15 6 - 23 mg/dL   Creatinine,  Ser 4.78 0.40 - 1.20 mg/dL   Calcium 9.4 8.4 - 29.5 mg/dL   GFR 621.30 >86.57 mL/min  Hepatic function panel  Result Value Ref Range   Total Bilirubin 0.6 0.2 - 1.2 mg/dL   Bilirubin, Direct 0.1 0.0 - 0.3 mg/dL   Alkaline Phosphatase 52 39 - 117 U/L   AST 19 0 - 37 U/L   ALT 21 0 - 35 U/L   Total Protein 7.0 6.0 - 8.3 g/dL   Albumin 4.5 3.5 - 5.2 g/dL  TSH  Result Value Ref Range   TSH 1.66 0.35 - 4.50 uIU/mL  CBC with Differential/Platelet  Result Value Ref Range   WBC 6.2 4.0 - 10.5 K/uL   RBC 4.80 3.87 - 5.11 Mil/uL   Hemoglobin 14.2 12.0 - 15.0 g/dL   HCT 81.1 91.4 - 78.2 %   MCV 85.8 78.0 - 100.0 fl   MCHC 34.4 30.0 - 36.0 g/dL   RDW 95.6 21.3 - 08.6 %   Platelets 237.0 150.0 - 400.0 K/uL   Neutrophils Relative % 58.5 43.0 - 77.0 %   Lymphocytes Relative 35.6 12.0 - 46.0 %   Monocytes Relative 4.9 3.0 - 12.0 %   Eosinophils Relative 0.4 0.0 - 5.0 %   Basophils Relative 0.6 0.0 - 3.0 %   Neutro Abs 3.6 1.4 - 7.7 K/uL   Lymphs Abs 2.2 0.7 - 4.0 K/uL   Monocytes Absolute 0.3 0.1 - 1.0 K/uL   Eosinophils Absolute 0.0 0.0 - 0.7 K/uL   Basophils Absolute 0.0 0.0 - 0.1 K/uL  Vit D  25 hydroxy (rtn osteoporosis monitoring)  Result Value Ref Range   VITD 35.01 30.00 - 100.00 ng/mL     EKG/XRAY:   Primary read interpreted by Dr. Conley Rolls at [facility].   ASSESSMENT/PLAN: Encounter Diagnoses  Name Primary?  . Acute recurrent maxillary sinusitis Yes  . Cough    Prescribed Augmentin, Hycodan, Diflucan for yeast with antibody X She will take over-the-counter cough medicines that are non-drowsy during the day. Advised that she should not take her alprazolam if she skin to be taking the Hycodan closely together. Follow-up as needed  Gross sideeffects, risk and benefits, and alternatives of medications d/w patient. Patient is aware that all medications have potential sideeffects and we are unable to predict every sideeffect or drug-drug interaction that may occur.  Charnae Lill PHUONG,  DO 04/30/2015 8:27 AM

## 2015-05-04 ENCOUNTER — Telehealth: Payer: Self-pay | Admitting: Family Medicine

## 2015-05-04 ENCOUNTER — Other Ambulatory Visit: Payer: Self-pay | Admitting: Family Medicine

## 2015-05-04 MED ORDER — GUAIFENESIN-CODEINE 200-20 MG/5ML PO LIQD: 5.0000 mL | Freq: Three times a day (TID) | ORAL | Status: DC | PRN

## 2015-05-04 MED ORDER — AZITHROMYCIN 250 MG PO TABS: ORAL_TABLET | ORAL | Status: DC

## 2015-05-04 MED ORDER — BENZONATATE 200 MG PO CAPS: 200.0000 mg | ORAL_CAPSULE | Freq: Three times a day (TID) | ORAL | Status: DC | PRN

## 2015-05-04 NOTE — Telephone Encounter (Signed)
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 Will switch her to Z pack. Rx tessalon perles and Guaifeneisn with codeine, called in.

## 2015-05-05 ENCOUNTER — Telehealth: Payer: Self-pay

## 2015-05-05 NOTE — Telephone Encounter (Signed)
[facility] because we called in a prescription for the patient last night and they don't have the quantity available. Please call! [phone]

## 2015-05-05 NOTE — Telephone Encounter (Signed)
Spoke to pharm who stated that the cough med called in last night is not available in the concentration called in. It is only available as 200/20 per 10 ml which is only 1/2 as concentrated as what was sent. I OKd change to double the amount on sig and quantity so that pt will have the same dosage and for same time period as Dr Conley RollsLe Rxd.

## 2015-05-20 ENCOUNTER — Ambulatory Visit (INDEPENDENT_AMBULATORY_CARE_PROVIDER_SITE_OTHER): Payer: 59 | Admitting: Medical

## 2015-05-20 ENCOUNTER — Encounter: Payer: Self-pay | Admitting: Medical

## 2015-05-20 VITALS — BP 112/70 | HR 63 | Temp 99.1°F | Ht 63.5 in | Wt 172.2 lb

## 2015-05-20 DIAGNOSIS — H109 Unspecified conjunctivitis: Secondary | ICD-10-CM

## 2015-05-20 MED ORDER — MOXIFLOXACIN HCL 0.5 % OP SOLN: 1.0000 [drp] | Freq: Three times a day (TID) | OPHTHALMIC | Status: AC

## 2015-05-20 MED ORDER — LORATADINE 10 MG PO TABS
10.0000 mg | ORAL_TABLET | Freq: Every day | ORAL | Status: AC
Start: 2015-05-20 — End: ?

## 2015-05-20 NOTE — Assessment & Plan Note (Signed)
By hx appears started like allergic but DC and matting to both eyes indicate bacterial component.  Rx claritin and vigamox. Approaching weekend so could update us by tomorrow 2 pm as approach the weekend.

## 2015-05-20 NOTE — Patient Instructions (Signed)
Conjunctivitis By hx appears started like allergic but DC and matting to both eyes indicate bacterial component.  Rx claritin and vigamox. Approaching weekend so could update us by tomorrow 2 pm as approach the weekend.    Follow 5 days or as needed

## 2015-05-20 NOTE — Progress Notes (Signed)
Subjective:    Patient ID: Jody [name], female    DOB: 1976/10/28, 39 y.o.   MRN: [id number]  HPI  Pt in with both eyes irritated for 4 days. Eyes started itching first and then watery. No sneezing or runny nose. Pt has yellow matting and sticky lids both eyes in morning.Pt tried some otc eye drops and polymyxin eye drops. Neither helped. Pt has no contacts. No known direct exposure to pink eye.   LMP- 3 wks ago.     Review of Systems  Constitutional: Negative for fever, chills and fatigue.  HENT: Negative.   Eyes: Positive for discharge, redness and itching. Negative for photophobia and visual disturbance.       Matting both eyes.  Respiratory: Negative for cough, chest tightness and wheezing.   Cardiovascular: Negative for chest pain and palpitations.  Neurological: Negative.    Past Medical History  Diagnosis Date  . History of pneumonia   . Nonspecific reaction to tuberculin skin test without active tuberculosis   . Hypercholesterolemia   . Borderline diabetes   . GERD (gastroesophageal reflux disease)   . Constipation   . Lumbago   . History of depression   . Depression   . Migraine   . Diabetes mellitus     borderline, no med  . Anxiety     History   Social History  . Marital Status: Married    Spouse Name: N/A  . Number of Children: 0  . Years of Education: N/A   Occupational History  . works for [employer]    Social History Main Topics  . Smoking status: Never Smoker   . Smokeless tobacco: Never Used  . Alcohol Use: 0.0 oz/week    0 Standard drinks or equivalent per week     Comment: occasional  . Drug Use: No  . Sexual Activity: Yes   Other Topics Concern  . Not on file   Social History Narrative   Separated, but in a relationship    Past Surgical History  Procedure Laterality Date  . Breast reduction surgery  1995  . Appendectomy  age 39  . Tonsillectomy  age 39    Family History  Problem Relation Age of Onset  .  Emphysema Maternal Grandfather   . COPD Maternal Grandfather   . Heart disease Maternal Grandfather   . Allergies Sister   . Asthma Sister   . Seizures Sister   . Heart disease Father   . Diabetes Father   . Heart disease Maternal Grandmother   . Clotting disorder Maternal Grandmother   . Rheum arthritis Maternal Grandmother   . Breast cancer Maternal Aunt   . Cancer Maternal Aunt   . Arthritis Mother   . Hyperlipidemia Mother   . Hypertension Mother   . Mental illness Mother   . Heart disease Paternal Grandmother   . Cancer Paternal Grandfather     prostate  . Heart disease Paternal Grandfather     Allergies  Allergen Reactions  . Sulfonamide Derivatives Rash    Current Outpatient Prescriptions on File Prior to Visit  Medication Sig Dispense Refill  . ALPRAZolam (XANAX) 1 MG tablet Take 1 tablet (1 mg total) by mouth 2 (two) times daily as needed for anxiety. 30 tablet 3  . Eszopiclone 3 MG TABS Take 1 tablet (3 mg total) by mouth at bedtime. Take immediately before bedtime 30 tablet 3  . FLUoxetine (PROZAC) 20 MG tablet TAKE 1 TABLET (20 MG TOTAL) BY MOUTH  DAILY. 30 tablet 3  . hydrocortisone-pramoxine (ANALPRAM HC) 2.5-1 % rectal cream Place 1 application rectally 3 (three) times daily. 30 g 0  . ondansetron (ZOFRAN ODT) 4 MG disintegrating tablet Take 1 tablet (4 mg total) by mouth every 8 (eight) hours as needed for nausea. 30 tablet 1  . pantoprazole (PROTONIX) 40 MG tablet Take 40 mg by mouth daily.    . SUMAtriptan (IMITREX) 100 MG tablet Take 1 tablet (100 mg total) by mouth once. May repeat in 2 hrs if needed 10 tablet 6   No current facility-administered medications on file prior to visit.    BP 112/70 mmHg  Pulse 63  Temp(Src) 99.1 F (37.3 C) (Oral)  Ht 5' 3.5" (1.613 m)  Wt 172 lb 3.2 oz (78.109 kg)  BMI 30.02 kg/m2  SpO2 100%  LMP 05/01/2015  Breastfeeding? No       Objective:   Physical Exam  General- No acute distress. Pleasant  patient. Neck- Full range of motion, no jvd Lungs- Clear, even and unlabored. Heart- regular rate and rhythm. Neurologic- CNII- XII grossly intact.  Eye Sclera/Conjunctiva- Both eyes- injected conjunctiva. Some dry matting discharge now in lower lids. Nose & Sinuses Nasal Mucosa- Left-  Boggy and Congested. Right-  Boggy and  Congested.Bilateral  No maxillary and no frontal sinus pressure. Mouth & Throat Lips: Upper Lip- Normal: no dryness, cracking, pallor, cyanosis, or vesicular eruption. Lower Lip-Normal: no dryness, cracking, pallor, cyanosis or vesicular eruption. Buccal Mucosa- Bilateral- No Aphthous ulcers. Oropharynx- No Discharge or Erythema. Tonsils: Characteristics- Bilateral- No Erythema or Congestion. Size/Enlargement- Bilateral- No enlargement. Discharge- bilateral-None.   Lymphatic Head & Neck General Head & Neck Lymphatics: Bilateral: Description- No Localized lymphadenopathy.       Assessment & Plan:

## 2015-05-20 NOTE — Progress Notes (Signed)
Pre visit review using our clinic review tool, if applicable. No additional management support is needed unless otherwise documented below in the visit note. 

## 2015-05-31 ENCOUNTER — Other Ambulatory Visit: Payer: Self-pay

## 2015-05-31 ENCOUNTER — Encounter: Payer: Self-pay | Admitting: Family Medicine

## 2015-05-31 MED ORDER — ALPRAZOLAM 1 MG PO TABS: 1.0000 mg | ORAL_TABLET | Freq: Two times a day (BID) | ORAL | Status: AC | PRN

## 2015-05-31 MED ORDER — ESZOPICLONE 3 MG PO TABS: 3.0000 mg | ORAL_TABLET | Freq: Every day | ORAL | Status: AC

## 2015-05-31 MED ORDER — FLUOXETINE HCL 20 MG PO TABS: 20.0000 mg | ORAL_TABLET | Freq: Every day | ORAL | Status: AC

## 2016-03-17 ENCOUNTER — Encounter: Payer: 59 | Admitting: Family Medicine
